# Patient Record
Sex: Female | Born: 1990 | Hispanic: Yes | Marital: Married | Smoking: Never smoker
Health system: Southern US, Community
[De-identification: ages and names within clinical notes are randomized; demographics above are authoritative.]

## PROBLEM LIST (undated history)

## (undated) DIAGNOSIS — F419 Anxiety disorder, unspecified: Secondary | ICD-10-CM

## (undated) DIAGNOSIS — J45909 Unspecified asthma, uncomplicated: Secondary | ICD-10-CM

## (undated) HISTORY — DX: Anxiety disorder, unspecified: F41.9

## (undated) HISTORY — PX: TONSILLECTOMY: SUR1361

## (undated) HISTORY — PX: TONSILLECTOMY AND ADENOIDECTOMY: SHX28

## (undated) HISTORY — DX: Unspecified asthma, uncomplicated: J45.909

---
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
## 2011-05-31 ENCOUNTER — Emergency Department (HOSPITAL_COMMUNITY)
Admission: EM | Admit: 2011-05-31 | Discharge: 2011-05-31 | Disposition: A | Discharge status: Home / Self Care | Payer: 59 | Attending: Emergency Medicine | Admitting: Emergency Medicine

## 2011-05-31 ENCOUNTER — Encounter: Payer: Self-pay | Admitting: Emergency Medicine

## 2011-05-31 DIAGNOSIS — IMO0002 Reserved for concepts with insufficient information to code with codable children: Secondary | ICD-10-CM | POA: Insufficient documentation

## 2011-05-31 DIAGNOSIS — N898 Other specified noninflammatory disorders of vagina: Secondary | ICD-10-CM | POA: Insufficient documentation

## 2011-05-31 LAB — POCT PREGNANCY, URINE: Preg Test, Ur: NEGATIVE

## 2011-05-31 LAB — WET PREP, GENITAL: Yeast Wet Prep HPF POC: NONE SEEN

## 2011-05-31 MED ORDER — LEVONORGESTREL 0.75 MG PO TABS: 0.7500 mg | ORAL_TABLET | Freq: Two times a day (BID) | ORAL | Status: DC

## 2011-05-31 NOTE — ED Notes (Signed)
Patient stable upon discharge.  

## 2011-05-31 NOTE — ED Notes (Signed)
Patient stable and resting
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
## 2011-06-01 LAB — GC/CHLAMYDIA PROBE AMP, GENITAL: Chlamydia, DNA Probe: NEGATIVE

## 2011-06-01 NOTE — ED Provider Notes (Signed)
Medical screening examination/treatment/procedure(s) were performed by non-physician practitioner and as supervising physician I was immediately available for consultation/collaboration.   Ardyce Heyer A Kainoah Bartosiewicz, MD 06/01/11 1702 

## 2011-07-12 ENCOUNTER — Encounter: Payer: 59 | Admitting: Family

## 2011-07-28 ENCOUNTER — Encounter: Payer: 59 | Admitting: Advanced Practice Midwife

## 2017-03-11 ENCOUNTER — Encounter (HOSPITAL_COMMUNITY): Payer: Self-pay | Admitting: Emergency Medicine

## 2017-03-11 ENCOUNTER — Ambulatory Visit (HOSPITAL_COMMUNITY)
Admission: EM | Admit: 2017-03-11 | Discharge: 2017-03-11 | Disposition: A | Discharge status: Home / Self Care | Payer: Self-pay | Attending: Internal Medicine | Admitting: Internal Medicine

## 2017-03-11 DIAGNOSIS — I951 Orthostatic hypotension: Secondary | ICD-10-CM

## 2017-03-11 DIAGNOSIS — R42 Dizziness and giddiness: Secondary | ICD-10-CM

## 2017-03-11 DIAGNOSIS — Z3202 Encounter for pregnancy test, result negative: Secondary | ICD-10-CM

## 2017-03-11 LAB — POCT I-STAT, CHEM 8
BUN: 14 mg/dL (ref 6–20)
CHLORIDE: 103 mmol/L (ref 101–111)
CREATININE: 0.7 mg/dL (ref 0.44–1.00)
Calcium, Ion: 1.17 mmol/L (ref 1.15–1.40)
GLUCOSE: 87 mg/dL (ref 65–99)
HCT: 46 % (ref 36.0–46.0)
HEMOGLOBIN: 15.6 g/dL — AB (ref 12.0–15.0)
POTASSIUM: 4.1 mmol/L (ref 3.5–5.1)
Sodium: 139 mmol/L (ref 135–145)
TCO2: 25 mmol/L (ref 22–32)

## 2017-03-11 LAB — POCT URINALYSIS DIP (DEVICE)
Bilirubin Urine: NEGATIVE
Glucose, UA: NEGATIVE mg/dL
Ketones, ur: NEGATIVE mg/dL
NITRITE: NEGATIVE
PH: 6 (ref 5.0–8.0)
Protein, ur: NEGATIVE mg/dL
Specific Gravity, Urine: 1.015 (ref 1.005–1.030)
Urobilinogen, UA: 0.2 mg/dL (ref 0.0–1.0)

## 2017-03-11 LAB — POCT PREGNANCY, URINE: PREG TEST UR: NEGATIVE

## 2017-03-11 MED ORDER — SODIUM CHLORIDE 0.9 % IV BOLUS (SEPSIS)
1000.0000 mL | Freq: Once | INTRAVENOUS | Status: AC
  Administered 2017-03-11: 1000 mL via INTRAVENOUS

## 2017-03-11 NOTE — Discharge Instructions (Signed)
Emergency Department Resource Guide °1) Find a Doctor and Pay Out of Pocket °Although you won't have to find out who is covered by your insurance plan, it is a good idea to ask around and get recommendations. You will then need to call the office and see if the doctor you have chosen will accept you as a new patient and what types of options they offer for patients who are self-pay. Some doctors offer discounts or will set up payment plans for their patients who do not have insurance, but you will need to ask so you aren't surprised when you get to your appointment. ° °2) Contact Your Local Health Department °Not all health departments have doctors that can see patients for sick visits, but many do, so it is worth a call to see if yours does. If you don't know where your local health department is, you can check in your phone book. The CDC also has a tool to help you locate your state's health department, and many state websites also have listings of all of their local health departments. ° °3) Find a Walk-in Clinic °If your illness is not likely to be very severe or complicated, you may want to try a walk in clinic. These are popping up all over the country in pharmacies, drugstores, and shopping centers. They're usually staffed by nurse practitioners or physician assistants that have been trained to treat common illnesses and complaints. They're usually fairly quick and inexpensive. However, if you have serious medical issues or chronic medical problems, these are probably not your best option. ° °No Primary Care Doctor: °- Call [facility] at  832-8000 - they can help you locate a primary care doctor that  accepts your insurance, provides certain services, etc. °- Physician Referral Service- 1-800-533-3463 ° °Chronic Pain Problems: °Organization         Address  Phone   Notes  °[facility]  ([phone]) [phone] Patients need to be referred by their primary care doctor.  ° °Medication  Assistance: °Organization         Address  Phone   Notes  °[place] [facility] [address] °[place], [place] [address] (336) 641-8030 --Must be a resident of [place] °-- Must have NO insurance coverage whatsoever (no Medicaid/ Medicare, etc.) °-- The pt. MUST have a primary care doctor that directs their care regularly and follows them in the community °  °[facility]  (866) 331-1348   °[employer]  (888) 892-1162   ° °Agencies that provide inexpensive medical care: °Organization         Address                                                       Phone                                                                            Notes  °[facility]  (336) 832-8035   °[facility]    (336)   832-7272   °[facility] [address] °[place], [place] [address] (336) 832-4777   °[facility] [address], °[place] (336) 271-4999   °[facility]    (336) 373-0678   °[facility]    (336) 272-1050   °[facility] ° [address], [place] Phone:  (336) 832-4444, Fax:  (336) 832-4440 Hours of Operation:  9 am - 6 pm, M-F.  Also accepts Medicaid/Medicare and self-pay.  °[facility] ° [address], [place] Phone: (336) [phone], Fax: (336) [phone]. Hours of Operation:  8:30 am - 5:30 pm, M-F.  Also accepts Medicaid and self-pay.  °[facility] [place] [address], [place] Phone: (336) 878-6027   °[facility] [address], [place], [place] (336)723-1848, Ext. 123 Mondays & Thursdays: 7-9 AM.  First 15 patients are seen on a first come, first serve basis. °  ° °Medicaid-accepting [place] Providers: ° °Organization         Address                                                                       Phone                               Notes  °[facility] [address], [place] (336) [phone] Also accepts self-pay patients.  °[facility] [address], [place] ° (336) [phone]   °[facility] [address], [place] (336) 288-8857   °[facility] [address] [place] [address], [place] (336) 299-7000   °[facility] [address], [place]  ° (336) [phone] Only accepts [place] Medicaid patients after they have their name applied to their card.  ° °Self-Pay (no insurance) in [place]: °  °Organization         Address                                                     Phone               Notes  °Sickle Cell Patients, [facility] [address], [place] (336) [phone]   °[facility] [address], [place] (336) [phone]   °[facility] ° [address], [place] (336) [phone]   °[facility]/Dr. Osei-Bonsu ° [address] [place] [address], [place] or [address], [place] (336) [phone] Phone number for both [place] and [place] locations is the same.  °[facility] [address], [place] (336) [phone]   °[facility] [address] [place] [address], [place] or [address] (336) 852-7530 °(336) 878-2260   °[facility] [address], [place] (336) 350-1642, phone; (336) 294-5005, fax Sees patients 1st and 3rd Saturday of   every month.  Must not qualify for public or private insurance (i.e. Medicaid, Medicare, [place] Health Choice, Veterans' Benefits) • Household income should be no more than 200% of the poverty level •The clinic cannot treat you if you are pregnant or think you are pregnant • Sexually transmitted diseases are not treated at the clinic.  ° °_____________Dental Care:______________ °Organization         Address                                  Phone                       Notes  °[place]  [facility] [address], [place] (336) [phone] Accepts children up to age 21 who are enrolled in Medicaid or [place] Health Choice; pregnant women with a Medicaid card; and children who have applied for Medicaid or [place] Health Choice, but were declined, whose parents can pay a reduced fee at time of service.  °[place] [place]  [address], [place] (336) 641-7733 Accepts children up to age 21 who are enrolled in Medicaid or [place] Health Choice; pregnant women with a Medicaid card; and children who have applied for Medicaid or [place] Health Choice, but were declined, whose parents can pay a reduced fee at time of service.  °[facility] ° [address], [place] (336) [phone] Patients are seen by appointment only. Walk-ins are not accepted. [facility] will see patients 18 years of age and older. °Monday - Tuesday (8am-5pm) °Most Wednesdays (8:30-5pm) °$30 per visit, cash only  °[facility] ° [address], [place] (336) [phone] Patients are seen by appointment only. Walk-ins are not accepted. [facility] will see patients 18 years of age and older. °One Wednesday Evening (Monthly: Volunteer Based).  $30 per visit, cash only  °[facility]  (919) [phone] for adults; Children under age 4, call Graduate Pediatric Dentistry at (919) [phone]. Children aged 4-14, please call (919) [phone] to request a pediatric application. ° Dental services are provided in all areas of dental care including fillings, crowns and bridges, complete and partial dentures, implants, gum treatment, root canals, and extractions. Preventive care is also provided. Treatment is provided to both adults and children. °Patients are selected via a lottery and there is often a waiting list. °  °[facility] [address], °[place] ° (336) 763-8833  [email or web address] °  °[facility] [address], [place], [place] (336)723-1848, Ext. 123 Second and Fourth Thursday of each month, opens at 6:30 AM; Clinic ends at 9 AM.  Patients are seen on a first-come first-served basis, and a limited number are seen during each clinic.  ° °[facility] ° [address], [place],  (336) [phone]   Eligibility Requirements °You must have lived in [place], [place], or [place] counties for at least the last three months. °  You cannot be eligible for state or federal sponsored healthcare insurance, including Veterans Administration, Medicaid, or Medicare. °  You generally cannot be eligible for healthcare insurance through your employer.  °  How to apply: °Eligibility screenings are held every Tuesday and Wednesday afternoon from 1:00 pm until 4:00 pm. You do not need an appointment for the interview!  °  [facility] [address], [place], [place] [phone]   °[facility]  [phone]   °[facility]  [phone]   °[facility]  [phone]   ° °

## 2017-03-11 NOTE — ED Provider Notes (Signed)
[facility]    CSN: [id number] Arrival date & time: 03/11/17  1104     History   Chief Complaint Chief Complaint  Patient presents with  . Dizziness    HPI Nancy Blevins is a 26 y.o. female.   HPI  Nancy Blevins is a 26 y.o. female presenting to UC with c/o near syncope with lightheadedness and dizziness that started yesterday.  She still has mild lightheadedness today.  Hx of similar episodes 3 other times but no known cause found.  She has a family hx of enlarged hearts but pt states she had an EKG and an echo in the past, both were normal. No hx of anemia. No recent head injury or illness. She has been eating and drinking well. She is on birth control but has been on that for multiple years. No other medications.    History reviewed. No pertinent past medical history.  Patient Active Problem List   Diagnosis Date Noted  . ASTHMA 01/26/2007    Past Surgical History:  Procedure Laterality Date  . TONSILLECTOMY      OB History    No data available       Home Medications    Prior to Admission medications   Medication Sig Start Date End Date Taking? Authorizing Provider  levonorgestrel (PLAN B) 0.75 MG tablet Take 1 tablet (0.75 mg total) by mouth every 12 (twelve) hours. 05/31/11   Grant Fontana, PA-C    Family History History reviewed. No pertinent family history.  Social History Social History  Substance Use Topics  . Smoking status: Never Smoker  . Smokeless tobacco: Never Used  . Alcohol use Yes     Allergies   Cefdinir; Cefuroxime axetil; Cephalosporins; and Penicillins   Review of Systems Review of Systems  Constitutional: Negative for chills and fever.  Eyes: Negative for visual disturbance.  Respiratory: Negative for chest tightness and shortness of breath.   Cardiovascular: Negative for chest pain and palpitations.  Gastrointestinal: Negative for nausea and vomiting.  Neurological: Positive for dizziness and  light-headedness. Negative for syncope, weakness and headaches.     Physical Exam Triage Vital Signs ED Triage Vitals [03/11/17 1127]  Enc Vitals Group     BP 90/65     Pulse Rate 80     Resp 16     Temp 98.2 F (36.8 C)     Temp Source Oral     SpO2 98 %     Weight      Height      Head Circumference      Peak Flow      Pain Score      Pain Loc      Pain Edu?      Excl. in GC?    Orthostatic VS for the past 24 hrs:  BP- Lying Pulse- Lying BP- Sitting Pulse- Sitting BP- Standing at 0 minutes Pulse- Standing at 0 minutes  03/11/17 1205 99/76 74 (!) 83/59 81 129/80 91    Updated Vital Signs BP 90/65 (BP Location: Left Arm)   Pulse 80   Temp 98.2 F (36.8 C) (Oral)   Resp 16   SpO2 98%     Physical Exam  Constitutional: She is oriented to person, place, and time. She appears well-developed and well-nourished. No distress.  HENT:  Head: Normocephalic and atraumatic.  Mouth/Throat: Oropharynx is clear and moist.  Eyes: Pupils are equal, round, and reactive to light. Conjunctivae and EOM are normal.  Neck:  Normal range of motion.  Cardiovascular: Normal rate and regular rhythm.   Pulmonary/Chest: Effort normal and breath sounds normal. No respiratory distress. She has no wheezes. She has no rales.  Musculoskeletal: Normal range of motion.  Neurological: She is alert and oriented to person, place, and time. No cranial nerve deficit.  CN II-XII in tact. Speech is clear, alert to person, place and time. Normal gait.   Skin: Skin is warm and dry. She is not diaphoretic.  Psychiatric: She has a normal mood and affect. Her behavior is normal.  Nursing note and vitals reviewed.    UC Treatments / Results  Labs (all labs ordered are listed, but only abnormal results are displayed) Labs Reviewed  POCT URINALYSIS DIP (DEVICE) - Abnormal; Notable for the following:       Result Value   Hgb urine dipstick TRACE (*)    Leukocytes, UA TRACE (*)    All other components  within normal limits  POCT I-STAT, CHEM 8 - Abnormal; Notable for the following:    Hemoglobin 15.6 (*)    All other components within normal limits  POCT PREGNANCY, URINE    EKG  EKG Interpretation None       Radiology No results found.  Procedures Procedures (including critical care time)  Medications Ordered in UC Medications  sodium chloride 0.9 % bolus 1,000 mL (1,000 mLs Intravenous New Bag/Given 03/11/17 1235)     Initial Impression / Assessment and Plan / UC Course  I have reviewed the triage vital signs and the nursing notes.  Pertinent labs & imaging results that were available during my care of the patient were reviewed by me and considered in my medical decision making (see chart for details).     UA and chem-8: unremarkable Urine preg: negative  Mild orthostatic hypotension. Pt given 1L IV fluids   Pt doing well, eating crackers and peanut butter.  Will have orthostatic vitals performed prior to discharge.  Pt will likely be discharged safely home to f/u with PCP. Care resumed at shift change by Azucena Fallen, PA-C.   Final Clinical Impressions(s) / UC Diagnoses   Final diagnoses:  Lightheadedness  Orthostatic hypotension    New Prescriptions New Prescriptions   No medications on file     Controlled Substance Prescriptions [place] Controlled Substance Registry consulted? Not Applicable   Rolla Plate 03/11/17 1305

## 2017-03-11 NOTE — ED Triage Notes (Signed)
Pt reports she "almost" had a syncope episode yest associated w/dizziness and LH  Has had x3 similar episodes this years  Sx have carried onto today  Sexually active in a monogamous relationship x3 years; does not use condoms... On BC pills.   Denies fevers, v/n/d, HAD

## 2017-03-15 ENCOUNTER — Telehealth: Payer: Self-pay

## 2017-03-15 NOTE — Telephone Encounter (Signed)
SENT NOTES TO SCHEDULING 

## 2017-03-16 ENCOUNTER — Telehealth: Payer: Self-pay | Admitting: Cardiology

## 2017-03-16 NOTE — Progress Notes (Signed)
Cardiology Office Note   Date:  03/17/2017   ID:  Nancy Blevins, DOB 10-23-1990, MRN [id number]  PCP:  Iona Hansen, NP  Cardiologist:   Cannie Muckle [place], MD   Chief Complaint  Patient presents with  . Near Syncope      History of Present Illness: Nancy Blevins is a 26 y.o. female who is seen at the request of Dr. Yetta Barre for evaluation of near syncope, palpitations, and hypotension.  She states that 3 times in the past year she has symptoms of tachycardia followed by a feeling of almost passing out. 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 Felt her heart racing first then developed lightheadedness and feeling she might pass out. Did not sit down. Symptoms improved after a few seconds. Prior episodes similar. No diaphoresis, N/V, dyspnea. No clear triggers. She does have a lot of anxiety. She was evaluated 2 years ago with a 48 hour Holter monitor that was normal. Was seen by Yates Decamp earlier this year and apparently Echo was done and was normal. No family history of syncope or premature death. No history of murmur. Generally she is in excellent health.     History reviewed. No pertinent past medical history.  Past Surgical History:  Procedure Laterality Date  . TONSILLECTOMY       Current Outpatient Prescriptions  Medication Sig Dispense Refill  . escitalopram (LEXAPRO) 10 MG tablet Take 5-10 mg by mouth daily.    Janetta Hora ESTRADIOL PO Take 1 tablet by mouth daily.     No current facility-administered medications for this visit.     Allergies:   Cefdinir; Cefuroxime axetil; Cefuroxime axetil; Cephalosporins; Penicillins; and Cefprozil    Social History:  The patient  reports that she has never smoked. She has never used smokeless tobacco. She reports that she drinks alcohol. She reports that she does not use drugs.   Family History:  The patient's family history is reviewed and is negative for any cardiac disease in immediate family.      ROS:  Please see the history of present illness.   Otherwise, review of systems are positive for none.   All other systems are reviewed and negative.    PHYSICAL EXAM: VS:  BP 100/72 (BP Location: Left Arm)   Pulse (!) 107   Ht 5' 4.5" (1.638 m)   Wt 113 lb 9.6 oz (51.5 kg)   BMI 19.20 kg/m  , BMI Body mass index is 19.2 kg/m.  Orthostatic BP 106/73 supine pulse 97 110/78 sitting, pulse 102, 111/77 standing, pulse 109.  GEN: Young, thin female, in no acute distress  HEENT: normal  Neck: no JVD, carotid bruits, or masses Cardiac: RRR; no murmurs, rubs, or gallops,no edema  Respiratory:  clear to auscultation bilaterally, normal work of breathing GI: soft, nontender, nondistended, + BS MS: no deformity or atrophy  Skin: warm and dry, no rash Neuro:  Strength and sensation are intact Psych: euthymic mood, full affect   EKG:  EKG is ordered today. The ekg ordered today demonstrates NSR rate 101, normal. I have personally reviewed and interpreted this study.    Recent Labs: 03/11/2017: BUN 14; Creatinine, Ser 0.70; Hemoglobin 15.6; Potassium 4.1; Sodium 139    Lipid Panel No results found for: CHOL, TRIG, HDL, CHOLHDL, VLDL, LDLCALC, LDLDIRECT    Wt Readings from Last 3 Encounters:  03/17/17 113 lb 9.6 oz (51.5 kg)      Other studies Reviewed: Additional studies/ records that  were reviewed today include:   Labs dated 06/17/16: normal CBC, CMET, TSH.  ASSESSMENT AND PLAN:  1.  Tachycardia associated with near syncope. Need to rule out primary arrhythmia. I suspect symptoms mostly related to a vascular reflex issue. Recommend staying well hydrated and liberalizing salt intake. Recommend regular aerobic exercise. Will have her wear a 30 day event monitor. Recently started on antianxiety therapy.   Current medicines are reviewed at length with the patient today.  The patient does not have concerns regarding medicines.  The following changes hav e been made:  no  change  Labs/ tests ordered today include:   Orders Placed This Encounter  Procedures  . Cardiac event monitor  . EKG 12-Lead     Disposition:   FU TBD  Signed, Karam Dunson [place], MD  03/17/2017 4:22 PM    [facility] [address] [place], [place], [address] Phone [phone], Fax [phone]

## 2017-03-16 NOTE — Telephone Encounter (Signed)
Received records from [facility] for appointment on 03/17/17 with Dr Swaziland.  Records put with Dr Elvis Coil schedule for 03/17/17. lp

## 2017-03-17 ENCOUNTER — Ambulatory Visit (INDEPENDENT_AMBULATORY_CARE_PROVIDER_SITE_OTHER): Payer: No Typology Code available for payment source | Admitting: Cardiology

## 2017-03-17 ENCOUNTER — Encounter: Payer: Self-pay | Admitting: Cardiology

## 2017-03-17 VITALS — BP 100/72 | HR 107 | Ht 64.5 in | Wt 113.6 lb

## 2017-03-17 DIAGNOSIS — R55 Syncope and collapse: Secondary | ICD-10-CM

## 2017-03-17 DIAGNOSIS — R002 Palpitations: Secondary | ICD-10-CM

## 2017-03-17 NOTE — Patient Instructions (Signed)
We will have you wear a 30 day event monitor  Liberalize sodium intake in diet and stay well hydrated

## 2017-04-14 ENCOUNTER — Ambulatory Visit (INDEPENDENT_AMBULATORY_CARE_PROVIDER_SITE_OTHER): Payer: No Typology Code available for payment source

## 2017-04-14 DIAGNOSIS — R002 Palpitations: Secondary | ICD-10-CM | POA: Diagnosis not present

## 2017-04-14 DIAGNOSIS — R55 Syncope and collapse: Secondary | ICD-10-CM | POA: Diagnosis not present

## 2017-08-20 NOTE — Progress Notes (Deleted)
Cardiology Office Note   Date:  08/20/2017   ID:  Nancy Blevins, DOB Jan 01, 1991, MRN [id number]  PCP:  Iona Hansen, NP  Cardiologist:   Findley Blankenbaker [place], MD   No chief complaint on file.     History of Present Illness: Nancy Blevins is a 27 y.o. female who is seen for follow up  of near syncope, palpitations, and hypotension.   She states that 3 times in the past year she has symptoms of tachycardia followed by a feeling of almost passing out. 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 Felt her heart racing first then developed lightheadedness and feeling she might pass out. Did not sit down. Symptoms improved after a few seconds. Prior episodes similar. No diaphoresis, N/V, dyspnea. No clear triggers. She does have a lot of anxiety. She was evaluated 2 years ago with a 48 hour Holter monitor that was normal. Was seen by Yates Decamp earlier last year and apparently Echo was done and was normal. No family history of syncope or premature death. No history of murmur. Generally she is in excellent health. On last visit she wore an event monitor that showed rare PACs.     No past medical history on file.  Past Surgical History:  Procedure Laterality Date  . TONSILLECTOMY       Current Outpatient Medications  Medication Sig Dispense Refill  . escitalopram (LEXAPRO) 10 MG tablet Take 5-10 mg by mouth daily.    Janetta Hora ESTRADIOL PO Take 1 tablet by mouth daily.     No current facility-administered medications for this visit.     Allergies:   Cefdinir; Cefuroxime axetil; Cefuroxime axetil; Cephalosporins; Penicillins; and Cefprozil    Social History:  The patient  reports that  has never smoked. she has never used smokeless tobacco. She reports that she drinks alcohol. She reports that she does not use drugs.   Family History:  The patient's family history is reviewed and is negative for any cardiac disease in immediate family.     ROS:  Please see  the history of present illness.   Otherwise, review of systems are positive for none.   All other systems are reviewed and negative.    PHYSICAL EXAM: VS:  There were no vitals taken for this visit. , BMI There is no height or weight on file to calculate BMI.  Orthostatic BP 106/73 supine pulse 97 110/78 sitting, pulse 102, 111/77 standing, pulse 109.  GEN: Young, thin female, in no acute distress  HEENT: normal  Neck: no JVD, carotid bruits, or masses Cardiac: RRR; no murmurs, rubs, or gallops,no edema  Respiratory:  clear to auscultation bilaterally, normal work of breathing GI: soft, nontender, nondistended, + BS MS: no deformity or atrophy  Skin: warm and dry, no rash Neuro:  Strength and sensation are intact Psych: euthymic mood, full affect   EKG:  EKG is ordered today. The ekg ordered today demonstrates NSR rate 101, normal. I have personally reviewed and interpreted this study.    Recent Labs: 03/11/2017: BUN 14; Creatinine, Ser 0.70; Hemoglobin 15.6; Potassium 4.1; Sodium 139    Lipid Panel No results found for: CHOL, TRIG, HDL, CHOLHDL, VLDL, LDLCALC, LDLDIRECT    Wt Readings from Last 3 Encounters:  03/17/17 113 lb 9.6 oz (51.5 kg)      Other studies Reviewed: Additional studies/ records that were reviewed today include:   Labs dated 06/17/16: normal CBC, CMET, TSH. Dated 03/11/17 normal Chemistries and  CBC  Event monitor:Study Highlights    Normal sinus rhythm with sinus tachycardia  Rare PACs.  Symptoms of fluttering appear to correlate with PACs       ASSESSMENT AND PLAN:  1.  Tachycardia associated with near syncope. Need to rule out primary arrhythmia. I suspect symptoms mostly related to a vascular reflex issue. Recommend staying well hydrated and liberalizing salt intake. Recommend regular aerobic exercise. Will have her wear a 30 day event monitor. Recently started on antianxiety therapy.   Current medicines are reviewed at length with the  patient today.  The patient does not have concerns regarding medicines.  The following changes hav e been made:  no change  Labs/ tests ordered today include:   No orders of the defined types were placed in this encounter.    Disposition:   FU TBD  Signed, Kortland Nichols [place], MD  08/20/2017 7:30 AM    [facility] [address], [place], [place], [address] Phone [phone], Fax [phone]

## 2017-08-22 ENCOUNTER — Ambulatory Visit: Payer: No Typology Code available for payment source | Admitting: Cardiology

## 2017-08-22 DIAGNOSIS — R0989 Other specified symptoms and signs involving the circulatory and respiratory systems: Secondary | ICD-10-CM

## 2017-08-23 ENCOUNTER — Encounter: Payer: Self-pay | Admitting: Cardiology

## 2017-09-21 ENCOUNTER — Ambulatory Visit (HOSPITAL_COMMUNITY)
Admission: EM | Admit: 2017-09-21 | Discharge: 2017-09-21 | Disposition: A | Discharge status: Home / Self Care | Payer: Self-pay | Attending: Urgent Care | Admitting: Urgent Care

## 2017-09-21 ENCOUNTER — Encounter (HOSPITAL_COMMUNITY): Payer: Self-pay | Admitting: Emergency Medicine

## 2017-09-21 DIAGNOSIS — R519 Headache, unspecified: Secondary | ICD-10-CM

## 2017-09-21 DIAGNOSIS — Z9109 Other allergy status, other than to drugs and biological substances: Secondary | ICD-10-CM

## 2017-09-21 DIAGNOSIS — R51 Headache: Principal | ICD-10-CM

## 2017-09-21 MED ORDER — LORATADINE 10 MG PO TABS: 10.0000 mg | ORAL_TABLET | Freq: Every day | ORAL | 11 refills | Status: DC

## 2017-09-21 MED ORDER — PSEUDOEPHEDRINE HCL ER 120 MG PO TB12: 120.0000 mg | ORAL_TABLET | Freq: Two times a day (BID) | ORAL | 3 refills | Status: DC

## 2017-09-21 MED ORDER — PREDNISONE 20 MG PO TABS: ORAL_TABLET | ORAL | 0 refills | Status: DC

## 2017-09-21 NOTE — Discharge Instructions (Signed)
Hydrate well with at least 2 liters (1 gallon) of water daily.  °

## 2017-09-21 NOTE — ED Provider Notes (Signed)
  MRN: [id number] DOB: 12/25/1990  Subjective:   Nancy Blevins is a 27 y.o. female presenting for 3 day history of sinus pressure, generalized pressure throughout her head, stuffy nose. Has a history of allergies, currently managing this with Flonase which she started yesterday.  She has tried Zyrtec in the past but felt that this was not working so she is not taking any antihistamine right now.  She did take over-the-counter Sudafed.  Denies fever, sinus pain, ear pain, ear drainage, sore throat, cough.  Denies smoking cigarettes.  No current facility-administered medications for this encounter.   Current Outpatient Medications:  .  escitalopram (LEXAPRO) 10 MG tablet, Take 5-10 mg by mouth daily., Disp: , Rfl:  .  NORETHINDRONE-ETH ESTRADIOL PO, Take 1 tablet by mouth daily., Disp: , Rfl:     Allergies  Allergen Reactions  . Cefdinir Other (See Comments)    dont remember   . Cefuroxime Axetil Other (See Comments)    dont remember  . Cefuroxime Axetil Other (See Comments)    dont remember  . Cephalosporins Other (See Comments)    Doesn't remember  . Penicillins   . Cefprozil Rash    PMH includes allergies, childhood asthma.    Past Surgical History:  Procedure Laterality Date  . TONSILLECTOMY      Objective:   Vitals: BP 116/79   Pulse 71   Temp 98.3 F (36.8 C)   Resp 16   SpO2 100%   Physical Exam  Constitutional: She is oriented to person, place, and time. She appears well-developed and well-nourished.  HENT:  Oropharynx with postnasal drainage.  No sinus tenderness.  TMs normal, without erythema, no tragus tenderness.  Eyes: Right eye exhibits no discharge. Left eye exhibits no discharge.  Neck: Normal range of motion. Neck supple.  Cardiovascular: Normal rate.  Pulmonary/Chest: Effort normal.  Lymphadenopathy:    She has no cervical adenopathy.  Neurological: She is alert and oriented to person, place, and time.  Skin: Skin is warm and dry.   Psychiatric: She has a normal mood and affect.   Assessment and Plan :   Sinus headache  Environmental allergies  Patient requested antibiotic for sinus infection.  I counseled that we should try to manage her allergies first with Flonase, Claritin, pseudoephedrine.  I provided patient with a prescription for prednisone in case she has no improvement by the weekend.  Patient is to return to clinic for recheck thereafter.    Wallis BambergMani, Roman Sandall, [place]PA-C 09/21/17 [id number]

## 2017-09-21 NOTE — ED Triage Notes (Signed)
Pt c/o pressure in her head x3, also states her L eye has been watering.

## 2017-11-08 ENCOUNTER — Encounter (HOSPITAL_COMMUNITY): Payer: Self-pay | Admitting: Emergency Medicine

## 2017-11-08 ENCOUNTER — Ambulatory Visit (HOSPITAL_COMMUNITY)
Admission: EM | Admit: 2017-11-08 | Discharge: 2017-11-08 | Disposition: A | Discharge status: Home / Self Care | Payer: Self-pay | Attending: Family Medicine | Admitting: Family Medicine

## 2017-11-08 DIAGNOSIS — N3090 Cystitis, unspecified without hematuria: Secondary | ICD-10-CM | POA: Diagnosis present

## 2017-11-08 DIAGNOSIS — Z880 Allergy status to penicillin: Secondary | ICD-10-CM | POA: Insufficient documentation

## 2017-11-08 DIAGNOSIS — Z881 Allergy status to other antibiotic agents status: Secondary | ICD-10-CM | POA: Insufficient documentation

## 2017-11-08 DIAGNOSIS — Z3202 Encounter for pregnancy test, result negative: Secondary | ICD-10-CM

## 2017-11-08 DIAGNOSIS — R300 Dysuria: Secondary | ICD-10-CM

## 2017-11-08 DIAGNOSIS — Z79899 Other long term (current) drug therapy: Secondary | ICD-10-CM | POA: Insufficient documentation

## 2017-11-08 LAB — POCT URINALYSIS DIP (DEVICE)
Bilirubin Urine: NEGATIVE
GLUCOSE, UA: NEGATIVE mg/dL
KETONES UR: NEGATIVE mg/dL
Nitrite: NEGATIVE
PH: 5.5 (ref 5.0–8.0)
PROTEIN: NEGATIVE mg/dL
Specific Gravity, Urine: <=1.005 (ref 1.005–1.030)
UROBILINOGEN UA: 0.2 mg/dL (ref 0.0–1.0)

## 2017-11-08 LAB — POCT PREGNANCY, URINE: PREG TEST UR: NEGATIVE

## 2017-11-08 MED ORDER — SULFAMETHOXAZOLE-TRIMETHOPRIM 800-160 MG PO TABS: 1.0000 | ORAL_TABLET | Freq: Two times a day (BID) | ORAL | 0 refills | Status: AC

## 2017-11-08 NOTE — Discharge Instructions (Signed)
Push fluids Take the antibiotic 2 x a day We will call with culture results

## 2017-11-08 NOTE — ED Triage Notes (Signed)
Pt sts dysuria  And vaginal discharge with odor

## 2017-11-08 NOTE — ED Provider Notes (Signed)
[facility]    CSN: [id number] Arrival date & time: 11/08/17  1800     History   Chief Complaint Chief Complaint  Patient presents with  . Dysuria    HPI Lori AVONELL LENIG is a 27 y.o. female.   HPI  Patient is here complaining of dysuria and frequency for 2 days.  No fever chills.  No nausea or vomiting.  No flank pain.  No prior history of any kidney stones or problems.  She states it feels like a bladder infection.  In addition she does have a scant amount of vaginal discharge.  She states that it is mildly malodorous.  No itching or discomfort.  She is been with her same partner for 4 years and declines need for any sexually transmitted disease testing.  History reviewed. No pertinent past medical history.  Patient Active Problem List   Diagnosis Date Noted  . Cystitis 11/08/2017  . ASTHMA 01/26/2007    Past Surgical History:  Procedure Laterality Date  . TONSILLECTOMY      OB History   None      Home Medications    Prior to Admission medications   Medication Sig Start Date End Date Taking? Authorizing Provider  escitalopram (LEXAPRO) 10 MG tablet Take 5-10 mg by mouth daily. 03/14/17   [provider]  loratadine (CLARITIN) 10 MG tablet Take 1 tablet (10 mg total) by mouth daily. 09/21/17   Wallis Bamberg, PA-C  NORETHINDRONE-ETH ESTRADIOL PO Take 1 tablet by mouth daily.    [provider]  predniSONE (DELTASONE) 20 MG tablet Take 2 tablets daily with breakfast. 09/21/17   Wallis Bamberg, PA-C  pseudoephedrine (SUDAFED 12 HOUR) 120 MG 12 hr tablet Take 1 tablet (120 mg total) by mouth 2 (two) times daily. 09/21/17   Wallis Bamberg, PA-C  sulfamethoxazole-trimethoprim (BACTRIM DS,SEPTRA DS) 800-160 MG tablet Take 1 tablet by mouth 2 (two) times daily for 3 days. 11/08/17 11/11/17  Eustace Moore, MD    Family History History reviewed. No pertinent family history.  Social History Social History   Tobacco Use  . Smoking status: Never  Smoker  . Smokeless tobacco: Never Used  Substance Use Topics  . Alcohol use: Yes  . Drug use: No     Allergies   Cefdinir; Cefuroxime axetil; Cefuroxime axetil; Cephalosporins; Penicillins; and Cefprozil   Review of Systems Review of Systems  Constitutional: Negative for chills and fever.  HENT: Negative for ear pain and sore throat.   Eyes: Negative for pain and visual disturbance.  Respiratory: Negative for cough and shortness of breath.   Cardiovascular: Negative for chest pain and palpitations.  Gastrointestinal: Negative for abdominal pain and vomiting.  Genitourinary: Positive for dysuria, frequency and vaginal discharge. Negative for flank pain and hematuria.  Musculoskeletal: Negative for arthralgias and back pain.  Skin: Negative for color change and rash.  Neurological: Negative for seizures and syncope.  All other systems reviewed and are negative.    Physical Exam Triage Vital Signs ED Triage Vitals [11/08/17 1831]  Enc Vitals Group     BP 108/69     Pulse Rate 71     Resp 18     Temp 98.7 F (37.1 C)     Temp Source Oral     SpO2 100 %     Weight      Height      Head Circumference      Peak Flow      Pain Score  Pain Loc      Pain Edu?      Excl. in GC?    No data found.  Updated Vital Signs BP 108/69 (BP Location: Left Arm)   Pulse 71   Temp 98.7 F (37.1 C) (Oral)   Resp 18   SpO2 100%   Visual Acuity Right Eye Distance:   Left Eye Distance:   Bilateral Distance:    Right Eye Near:   Left Eye Near:    Bilateral Near:     Physical Exam  Constitutional: She appears well-developed and well-nourished. No distress.  HENT:  Head: Normocephalic and atraumatic.  Mouth/Throat: Oropharynx is clear and moist.  Eyes: Pupils are equal, round, and reactive to light. Conjunctivae are normal.  Neck: Normal range of motion.  Cardiovascular: Normal rate, regular rhythm and normal heart sounds.  Pulmonary/Chest: Effort normal and breath  sounds normal. No respiratory distress.  No CVAT  Abdominal: Soft. She exhibits no distension.  No abdominal or suprapubic tenderness  Musculoskeletal: Normal range of motion. She exhibits no edema.  Neurological: She is alert.  Skin: Skin is warm and dry.     UC Treatments / Results  Labs (all labs ordered are listed, but only abnormal results are displayed) Labs Reviewed  POCT URINALYSIS DIP (DEVICE) - Abnormal; Notable for the following components:      Result Value   Hgb urine dipstick SMALL (*)    Leukocytes, UA SMALL (*)    All other components within normal limits  URINE CULTURE  POCT PREGNANCY, URINE  URINE CYTOLOGY ANCILLARY ONLY    EKG None  Radiology No results found.  Procedures Procedures (including critical care time)  Medications Ordered in UC Medications - No data to display  Initial Impression / Assessment and Plan / UC Course  I have reviewed the triage vital signs and the nursing notes.  Pertinent labs & imaging results that were available during my care of the patient were reviewed by me and considered in my medical decision making (see chart for details).     Discussed bladder infections, cystitis.  No evidence of kidney involvement.  Discussed vaginal infections.  Discussed STDs. Final Clinical Impressions(s) / UC Diagnoses   Final diagnoses:  Cystitis     Discharge Instructions     Push fluids Take the antibiotic 2 x a day We will call with culture results   ED Prescriptions    Medication Sig Dispense Auth. Provider   sulfamethoxazole-trimethoprim (BACTRIM DS,SEPTRA DS) 800-160 MG tablet Take 1 tablet by mouth 2 (two) times daily for 3 days. 6 tablet Eustace Moore, MD     Controlled Substance Prescriptions [place] Controlled Substance Registry consulted? Not Applicable   Eustace Moore, MD 11/08/17 [phone]

## 2017-11-09 ENCOUNTER — Emergency Department (HOSPITAL_COMMUNITY)
Admission: EM | Admit: 2017-11-09 | Discharge: 2017-11-09 | Disposition: A | Discharge status: Home / Self Care | Payer: Self-pay | Attending: Emergency Medicine | Admitting: Emergency Medicine

## 2017-11-09 ENCOUNTER — Encounter (HOSPITAL_COMMUNITY): Payer: Self-pay

## 2017-11-09 DIAGNOSIS — J45909 Unspecified asthma, uncomplicated: Secondary | ICD-10-CM | POA: Insufficient documentation

## 2017-11-09 DIAGNOSIS — Z79899 Other long term (current) drug therapy: Secondary | ICD-10-CM | POA: Insufficient documentation

## 2017-11-09 DIAGNOSIS — N12 Tubulo-interstitial nephritis, not specified as acute or chronic: Secondary | ICD-10-CM | POA: Insufficient documentation

## 2017-11-09 LAB — URINALYSIS, ROUTINE W REFLEX MICROSCOPIC
BILIRUBIN URINE: NEGATIVE
GLUCOSE, UA: NEGATIVE mg/dL
Ketones, ur: 20 mg/dL — AB
Nitrite: NEGATIVE
PROTEIN: 30 mg/dL — AB
Specific Gravity, Urine: 1.023 (ref 1.005–1.030)
pH: 5 (ref 5.0–8.0)

## 2017-11-09 LAB — CBC
HEMATOCRIT: 40.3 % (ref 36.0–46.0)
HEMOGLOBIN: 13.7 g/dL (ref 12.0–15.0)
MCH: 29.3 pg (ref 26.0–34.0)
MCHC: 34 g/dL (ref 30.0–36.0)
MCV: 86.3 fL (ref 78.0–100.0)
PLATELETS: 288 10*3/uL (ref 150–400)
RBC: 4.67 MIL/uL (ref 3.87–5.11)
RDW: 12.9 % (ref 11.5–15.5)
WBC: 16.1 10*3/uL — ABNORMAL HIGH (ref 4.0–10.5)

## 2017-11-09 LAB — BASIC METABOLIC PANEL
Anion gap: 10 (ref 5–15)
BUN: 16 mg/dL (ref 6–20)
CO2: 21 mmol/L — AB (ref 22–32)
CREATININE: 0.59 mg/dL (ref 0.44–1.00)
Calcium: 7.9 mg/dL — ABNORMAL LOW (ref 8.9–10.3)
Chloride: 108 mmol/L (ref 101–111)
GFR calc non Af Amer: >60 mL/min (ref >60)
Glucose, Bld: 111 mg/dL — ABNORMAL HIGH (ref 65–99)
Potassium: 3.8 mmol/L (ref 3.5–5.1)
Sodium: 139 mmol/L (ref 135–145)

## 2017-11-09 LAB — I-STAT CG4 LACTIC ACID, ED
Lactic Acid, Venous: 2.23 mmol/L (ref 0.5–1.9)
Lactic Acid, Venous: 2.17 mmol/L (ref 0.5–1.9)

## 2017-11-09 LAB — CBG MONITORING, ED: GLUCOSE-CAPILLARY: 98 mg/dL (ref 65–99)

## 2017-11-09 LAB — I-STAT BETA HCG BLOOD, ED (MC, WL, AP ONLY): I-stat hCG, quantitative: <5 m[IU]/mL (ref <5)

## 2017-11-09 MED ORDER — ONDANSETRON HCL 4 MG/2ML IJ SOLN
4.0000 mg | Freq: Once | INTRAMUSCULAR | Status: DC
  Filled 2017-11-09: qty 2

## 2017-11-09 MED ORDER — SODIUM CHLORIDE 0.9 % IV BOLUS
1000.0000 mL | Freq: Once | INTRAVENOUS | Status: AC
  Administered 2017-11-09: 1000 mL via INTRAVENOUS

## 2017-11-09 MED ORDER — CIPROFLOXACIN IN D5W 400 MG/200ML IV SOLN
400.0000 mg | Freq: Once | INTRAVENOUS | Status: AC
  Administered 2017-11-09: 400 mg via INTRAVENOUS
  Filled 2017-11-09: qty 200

## 2017-11-09 MED ORDER — CIPROFLOXACIN HCL 500 MG PO TABS: 500.0000 mg | ORAL_TABLET | Freq: Two times a day (BID) | ORAL | 0 refills | Status: DC

## 2017-11-09 MED ORDER — MORPHINE SULFATE (PF) 2 MG/ML IV SOLN
2.0000 mg | Freq: Once | INTRAVENOUS | Status: DC
  Filled 2017-11-09: qty 1

## 2017-11-09 MED ORDER — ONDANSETRON HCL 4 MG PO TABS: 4.0000 mg | ORAL_TABLET | Freq: Four times a day (QID) | ORAL | 0 refills | Status: DC

## 2017-11-09 NOTE — ED Notes (Signed)
Pt stated that she started a sulfa antibiotic last night. Soon after she started vomiting and had stomach cramping. She is on the antibiotic for a UTI. This is her first dose.

## 2017-11-09 NOTE — ED Notes (Signed)
Patient tolerating po fluids well

## 2017-11-09 NOTE — ED Notes (Signed)
Patient given water for po challenge 

## 2017-11-09 NOTE — ED Triage Notes (Signed)
Pt called EMS because she was vomiting after starting an antibiotic for a UTI Pt is also having orthostatic changes

## 2017-11-09 NOTE — ED Provider Notes (Addendum)
[facility]PT Provider Note   CSN: [id number] Arrival date & time: 11/09/17  0119    History   Chief Complaint No chief complaint on file.   HPI Nancy Blevins is a 27 y.o. female.  HPI   27 year old female presenting for further management of recent diagnosed urinary tract infection.  Patient endorsed urinary discomfort with frequency for the past 3 days.  She was seen and evaluated at [facility] yesterday for that. She did note some scant vaginal discharge but was not concerned about potential STI and did not receive testing for that.  Her urine shows evidence of urinary tract infection.  She did not have any systemic problem.  She was subsequently discharged home with Bactrim.  She did mention taking 1 dose of Bactrim at home and subsequently had persistent nausea, and vomiting.  States she may have vomited 15-20 times of fluid content, later with small trace of blood but no bilious content.  She endorsed lightheadedness and dizziness and feeling very weak.  She also endorsed worsening abdominal cramping.  Patient abdominal pain is moderate in severity.  EMS was contacted and patient brought here for further evaluation.  Since being in the ED, her nausea has since subsided but she still endorse abdominal cramping.  Denies fever, chills, back pain.  No history of recurrent urinary tract infection.  Patient was found to have positive orthostatic vital signs per EMS.  History reviewed. No pertinent past medical history.  Patient Active Problem List   Diagnosis Date Noted  . Cystitis 11/08/2017  . ASTHMA 01/26/2007    Past Surgical History:  Procedure Laterality Date  . TONSILLECTOMY       OB History   None      Home Medications    Prior to Admission medications   Medication Sig Start Date End Date Taking? Authorizing Provider  escitalopram (LEXAPRO) 10 MG tablet Take 5-10 mg by mouth daily. 03/14/17  Yes [provider]    NORETHINDRONE-ETH ESTRADIOL PO Take 1 tablet by mouth daily.   Yes [provider]  loratadine (CLARITIN) 10 MG tablet Take 1 tablet (10 mg total) by mouth daily. 09/21/17   Wallis Bamberg, PA-C  sulfamethoxazole-trimethoprim (BACTRIM DS,SEPTRA DS) 800-160 MG tablet Take 1 tablet by mouth 2 (two) times daily for 3 days. 11/08/17 11/11/17  Eustace Moore, MD    Family History History reviewed. No pertinent family history.  Social History Social History   Tobacco Use  . Smoking status: Never Smoker  . Smokeless tobacco: Never Used  Substance Use Topics  . Alcohol use: Yes  . Drug use: No     Allergies   Cefdinir; Cefuroxime axetil; Cefuroxime axetil; Cephalosporins; Penicillins; and Cefprozil   Review of Systems Review of Systems  All other systems reviewed and are negative.    Physical Exam Updated Vital Signs BP 101/68 (BP Location: Right Arm)   Pulse 88   Temp 97.6 F (36.4 C) (Oral)   Resp 20   Ht  (1.651 m)   Wt 52.2 kg (115 lb)   SpO2 99%   BMI 19.14 kg/m   Physical Exam  Constitutional: She is oriented to person, place, and time. She appears well-developed and well-nourished. No distress.  HENT:  Head: Atraumatic.  Eyes: Conjunctivae are normal.  Neck: Neck supple.  Cardiovascular: Normal rate and regular rhythm.  Pulmonary/Chest: Effort normal and breath sounds normal. She has no wheezes. She has no rales.  Abdominal: Soft. Bowel sounds are normal.  She exhibits no distension. There is tenderness (Mild diffuse abdominal tenderness without guarding or rebound tenderness.  Negative Murphy sign, no pain at McBurney's point.).  Genitourinary:  Genitourinary Comments: No CVA tenderness  Neurological: She is alert and oriented to person, place, and time.  Skin: No rash noted.  Psychiatric: She has a normal mood and affect.  Nursing note and vitals reviewed.    ED Treatments / Results  Labs (all labs ordered are listed, but only abnormal  results are displayed) Labs Reviewed  BASIC METABOLIC PANEL - Abnormal; Notable for the following components:      Result Value   CO2 21 (*)    Glucose, Bld 111 (*)    Calcium 7.9 (*)    All other components within normal limits  CBC - Abnormal; Notable for the following components:   WBC 16.1 (*)    All other components within normal limits  URINALYSIS, ROUTINE W REFLEX MICROSCOPIC - Abnormal; Notable for the following components:   APPearance HAZY (*)    Hgb urine dipstick SMALL (*)    Ketones, ur 20 (*)    Protein, ur 30 (*)    Leukocytes, UA MODERATE (*)    WBC, UA >50 (*)    Bacteria, UA RARE (*)    Non Squamous Epithelial 0-5 (*)    All other components within normal limits  I-STAT CG4 LACTIC ACID, ED - Abnormal; Notable for the following components:   Lactic Acid, Venous 2.23 (*)    All other components within normal limits  I-STAT CG4 LACTIC ACID, ED - Abnormal; Notable for the following components:   Lactic Acid, Venous 2.17 (*)    All other components within normal limits  CULTURE, BLOOD (ROUTINE X 2)  CULTURE, BLOOD (ROUTINE X 2)  CBG MONITORING, ED  I-STAT BETA HCG BLOOD, ED ([facility], [facility], [facility] ONLY)  I-STAT CG4 LACTIC ACID, ED    EKG EKG Interpretation  Date/Time:  Wednesday Nov 09 2017 02:52:04 EDT Ventricular Rate:  94 PR Interval:    QRS Duration: 90 QT Interval:  364 QTC Calculation: 456 R Axis:   93 Text Interpretation:  Sinus rhythm Borderline right axis deviation No significant change was found Confirmed by Paula Libra ([id number]) on 11/09/2017 2:56:24 AM Also confirmed by Paula Libra ([id number]), editor Earle Gell, Nash Dimmer [phone])  on 11/09/2017 8:50:26 AM   Radiology No results found.  Procedures Procedures (including critical care time)  Medications Ordered in ED Medications  ondansetron (ZOFRAN) injection 4 mg (4 mg Intravenous Refused 11/09/17 0700)  morphine 2 MG/ML injection 2 mg (2 mg Intravenous Refused 11/09/17 0700)  sodium chloride 0.9 % bolus 1,000 mL  (0 mLs Intravenous Stopped 11/09/17 0825)  ciprofloxacin (CIPRO) IVPB 400 mg (0 mg Intravenous Stopped 11/09/17 0826)  sodium chloride 0.9 % bolus 1,000 mL (0 mLs Intravenous Stopped 11/09/17 1017)     Initial Impression / Assessment and Plan / ED Course  I have reviewed the triage vital signs and the nursing notes.  Pertinent labs & imaging results that were available during my care of the patient were reviewed by me and considered in my medical decision making (see chart for details).     BP 95/63 (BP Location: Left Arm)   Pulse 97   Temp 100 F (37.8 C) (Rectal)   Resp 18   Ht  (1.651 m)   Wt 52.2 kg (115 lb)   SpO2 100%   BMI 19.14 kg/m    Final Clinical Impressions(s) / ED Diagnoses  Final diagnoses:  Pyelonephritis    ED Discharge Orders        Ordered    ciprofloxacin (CIPRO) 500 MG tablet  2 times daily     11/09/17 1309    ondansetron (ZOFRAN) 4 MG tablet  Every 6 hours     11/09/17 1309     6:50 AM Patient recently diagnosed with urinary tract infection however she is here due to persistent nausea vomiting after taking first dose of Bactrim.  Report of positive orthostatic vital signs by EMS.  On exam patient has mild abdominal discomfort without peritoneal sign.  No CVA tenderness.  Soft blood pressure however she is not tachycardic, no hypoxia, and no fever.  At this time, patient given IV fluid as well as IV ciprofloxacin since patient is allergic to cephalosporin, and penicillin agent.  We will continue to monitor.  She does have leukocytosis with WBC 16.1 and urine show signs of infection. Consider work up for sepsis, will check lactic acid. No focal point tenderness to right lower quadrant to suggest appendicitis at this time.  9:23 AM Labs remarkable for a lactic acid of 2.23,, WBC of 16.1, urine shows signs of urinary tract infection, and patient have been hypotensive with systolic blood pressures in the 80s despite receiving IV fluid.  Rectal  temperature of 100.  Code sepsis initiated.  Patient did report feeling improved with IV fluid and antibiotic.  10:55 AM Appreciate consultation from tried hospitalist, Dr. Caleb Popp who agrees to see and admit patient for further management of her condition.  Sepsis reassessment done.  Blood pressure did improve.  1:10 PM Pt was evaluated by [facility] but does not want to be admitted.  She felt comfortable going home.  She will be treated with ciprofloxacin and zofran. Return precaution given.   CRITICAL CARE Performed by: Fayrene Helper Total critical care time: 30 minutes Critical care time was exclusive of separately billable procedures and treating other patients. Critical care was necessary to treat or prevent imminent or life-threatening deterioration. Critical care was time spent personally by me on the following activities: development of treatment plan with patient and/or surrogate as well as nursing, discussions with consultants, evaluation of patient's response to treatment, examination of patient, obtaining history from patient or surrogate, ordering and performing treatments and interventions, ordering and review of laboratory studies, ordering and review of radiographic studies, pulse oximetry and re-evaluation of patient's condition.    Fayrene Helper, PA-C 11/09/17 1057    Fayrene Helper, PA-C 11/09/17 1310    Molpus, Jonny Ruiz, MD 11/09/17 2246

## 2017-11-09 NOTE — Discharge Instructions (Signed)
Take antibiotic as prescribed for the full duration.  Take zofran as needed for nausea.  Return if your condition worsen or if you have other concerns.  

## 2017-11-10 LAB — URINE CULTURE

## 2017-11-11 LAB — URINE CYTOLOGY ANCILLARY ONLY
Bacterial vaginitis: NEGATIVE
CANDIDA VAGINITIS: NEGATIVE

## 2017-11-14 LAB — CULTURE, BLOOD (ROUTINE X 2)
Culture: NO GROWTH
Culture: NO GROWTH
Special Requests: ADEQUATE
Special Requests: ADEQUATE

## 2017-11-23 ENCOUNTER — Ambulatory Visit (HOSPITAL_COMMUNITY)
Admission: EM | Admit: 2017-11-23 | Discharge: 2017-11-23 | Disposition: A | Discharge status: Home / Self Care | Payer: Self-pay | Attending: Family Medicine | Admitting: Family Medicine

## 2017-11-23 ENCOUNTER — Encounter (HOSPITAL_COMMUNITY): Payer: Self-pay | Admitting: Emergency Medicine

## 2017-11-23 ENCOUNTER — Other Ambulatory Visit: Payer: Self-pay

## 2017-11-23 DIAGNOSIS — Z87440 Personal history of urinary (tract) infections: Secondary | ICD-10-CM | POA: Insufficient documentation

## 2017-11-23 DIAGNOSIS — Z880 Allergy status to penicillin: Secondary | ICD-10-CM | POA: Insufficient documentation

## 2017-11-23 DIAGNOSIS — N898 Other specified noninflammatory disorders of vagina: Secondary | ICD-10-CM | POA: Insufficient documentation

## 2017-11-23 DIAGNOSIS — Z881 Allergy status to other antibiotic agents status: Secondary | ICD-10-CM | POA: Insufficient documentation

## 2017-11-23 DIAGNOSIS — Z79899 Other long term (current) drug therapy: Secondary | ICD-10-CM | POA: Insufficient documentation

## 2017-11-23 DIAGNOSIS — R300 Dysuria: Secondary | ICD-10-CM

## 2017-11-23 LAB — POCT URINALYSIS DIP (DEVICE)
Bilirubin Urine: NEGATIVE
Glucose, UA: NEGATIVE mg/dL
HGB URINE DIPSTICK: NEGATIVE
Ketones, ur: NEGATIVE mg/dL
Nitrite: NEGATIVE
PROTEIN: NEGATIVE mg/dL
Specific Gravity, Urine: 1.01 (ref 1.005–1.030)
UROBILINOGEN UA: 0.2 mg/dL (ref 0.0–1.0)
pH: 6.5 (ref 5.0–8.0)

## 2017-11-23 LAB — POCT PREGNANCY, URINE: Preg Test, Ur: NEGATIVE

## 2017-11-23 MED ORDER — FLUCONAZOLE 150 MG PO TABS: 150.0000 mg | ORAL_TABLET | Freq: Once | ORAL | 0 refills | Status: AC

## 2017-11-23 NOTE — ED Triage Notes (Signed)
Seen 2 weeks ago for a uti.  Prescribed med made patient sick.  Then seen at ed and changed antibiotic.  ED antibiotic was completed one week.  Patient says she never quite felt "well".  Symptoms improved, some resolved, some continued.  Currently feeling itchiness and urinating frequently

## 2017-11-23 NOTE — ED Provider Notes (Signed)
[facility]    CSN: [id number] Arrival date & time: 11/23/17  1005     History   Chief Complaint Chief Complaint  Patient presents with  . Recurrent UTI    HPI Nancy Blevins is a 27 y.o. female presenting today for evaluation of increased urinary frequency itching with urination.  Patient states that 2 weeks ago she was seen here and treated for UTI with Bactrim, the Bactrim made her feel sick, she went to the emergency room and was treated with Cipro.  She finished taking this medicine a week ago.  She states that the dysuria and pressure has improved; but itching and frequency have persisted.  She does state that she has vaginal discharge, but this is normal for her.  Denies fever, nausea, vomiting, abdominal pain.  HPI  History reviewed. No pertinent past medical history.  Patient Active Problem List   Diagnosis Date Noted  . Cystitis 11/08/2017  . ASTHMA 01/26/2007    Past Surgical History:  Procedure Laterality Date  . TONSILLECTOMY      OB History   None      Home Medications    Prior to Admission medications   Medication Sig Start Date End Date Taking? Authorizing Provider  CRANBERRY PO Take 1 tablet by mouth daily.    [provider]  escitalopram (LEXAPRO) 10 MG tablet Take 5-10 mg by mouth daily. 03/14/17   [provider]  fluconazole (DIFLUCAN) 150 MG tablet Take 1 tablet (150 mg total) by mouth once for 1 dose. 11/23/17 11/23/17  Loryn Haacke C, PA-C  Multiple Vitamin (MULTIVITAMIN WITH MINERALS) TABS tablet Take 1 tablet by mouth daily.    [provider]  NORETHINDRONE-ETH ESTRADIOL PO Take 1 tablet by mouth daily.    [provider]    Family History No family history on file.  Social History Social History   Tobacco Use  . Smoking status: Never Smoker  . Smokeless tobacco: Never Used  Substance Use Topics  . Alcohol use: Yes  . Drug use: No     Allergies   Cefdinir; Cefuroxime  axetil; Cefuroxime axetil; Cephalosporins; Penicillins; and Cefprozil   Review of Systems Review of Systems  Constitutional: Negative for fever.  Respiratory: Negative for shortness of breath.   Cardiovascular: Negative for chest pain.  Gastrointestinal: Negative for abdominal pain, diarrhea, nausea and vomiting.  Genitourinary: Positive for dysuria, frequency and vaginal discharge. Negative for flank pain, genital sores, hematuria, menstrual problem, vaginal bleeding and vaginal pain.  Musculoskeletal: Negative for back pain.  Skin: Negative for rash.  Neurological: Negative for dizziness, light-headedness and headaches.     Physical Exam Triage Vital Signs ED Triage Vitals  Enc Vitals Group     BP 11/23/17 1040 107/71     Pulse Rate 11/23/17 1040 89     Resp 11/23/17 1040 16     Temp 11/23/17 1040 98.7 F (37.1 C)     Temp Source 11/23/17 1040 Oral     SpO2 11/23/17 1040 99 %     Weight --      Height --      Head Circumference --      Peak Flow --      Pain Score 11/23/17 1038 0     Pain Loc --      Pain Edu? --      Excl. in GC? --    No data found.  Updated Vital Signs BP 107/71 (BP Location: Left Arm)  Pulse 89   Temp 98.7 F (37.1 C) (Oral)   Resp 16   SpO2 99%   Visual Acuity Right Eye Distance:   Left Eye Distance:   Bilateral Distance:    Right Eye Near:   Left Eye Near:    Bilateral Near:     Physical Exam  Constitutional: She appears well-developed and well-nourished. No distress.  HENT:  Head: Normocephalic and atraumatic.  Eyes: Conjunctivae are normal.  Neck: Neck supple.  Cardiovascular: Normal rate and regular rhythm.  No murmur heard. Pulmonary/Chest: Effort normal and breath sounds normal. No respiratory distress.  Abdominal: Soft. There is no tenderness.  Nontender light deep palpation throughout all 4 quadrants and epigastrium, negative CVA tenderness  Musculoskeletal: She exhibits no edema.  Neurological: She is alert.  Skin:  Skin is warm and dry.  Psychiatric: She has a normal mood and affect.  Nursing note and vitals reviewed.    UC Treatments / Results  Labs (all labs ordered are listed, but only abnormal results are displayed) Labs Reviewed  POCT URINALYSIS DIP (DEVICE) - Abnormal; Notable for the following components:      Result Value   Leukocytes, UA TRACE (*)    All other components within normal limits  URINE CULTURE  POCT PREGNANCY, URINE  CERVICOVAGINAL ANCILLARY ONLY    EKG None  Radiology No results found.  Procedures Procedures (including critical care time)  Medications Ordered in UC Medications - No data to display  Initial Impression / Assessment and Plan / UC Course  I have reviewed the triage vital signs and the nursing notes.  Pertinent labs & imaging results that were available during my care of the patient were reviewed by me and considered in my medical decision making (see chart for details).     Trace leuks, will send off for culture.  At this time we will not initiate antibiotics.  Will treat for yeast infection given itchiness and recent antibiotic use.  Vaginal swab obtained to confirm and to check for vaginal causes of itching and frequency.Discussed strict return precautions. Patient verbalized understanding and is agreeable with plan.  Final Clinical Impressions(s) / UC Diagnoses   Final diagnoses:  Dysuria     Discharge Instructions     Your urine did not show signs of infection today, we will send it off for culture to confirm.  I will go ahead and treat you for a yeast infection as cause of the itching with Diflucan.,  Please take 1 tablet today, may repeat in 3 days.  We are testing you for Gonorrhea, Chlamydia, Trichomonas, Yeast and Bacterial Vaginosis. We will call you if anything is positive and let you know if you require any further treatment. Please inform partners of any positive results.   Please return if symptoms not improving with  treatment, development of fever, nausea, vomiting, abdominal pain.    ED Prescriptions    Medication Sig Dispense Auth. Provider   fluconazole (DIFLUCAN) 150 MG tablet Take 1 tablet (150 mg total) by mouth once for 1 dose. 2 tablet Jenafer Winterton C, PA-C     Controlled Substance Prescriptions [place] Controlled Substance Registry consulted? Not Applicable   Lew DawesWieters, Andi Mahaffy C, [place]PA-C 11/23/17 1113

## 2017-11-23 NOTE — Discharge Instructions (Signed)
Your urine did not show signs of infection today, we will send it off for culture to confirm.  I will go ahead and treat you for a yeast infection as cause of the itching with Diflucan.,  Please take 1 tablet today, may repeat in 3 days.  We are testing you for Gonorrhea, Chlamydia, Trichomonas, Yeast and Bacterial Vaginosis. We will call you if anything is positive and let you know if you require any further treatment. Please inform partners of any positive results.   Please return if symptoms not improving with treatment, development of fever, nausea, vomiting, abdominal pain.

## 2017-11-24 LAB — URINE CULTURE

## 2017-11-24 LAB — CERVICOVAGINAL ANCILLARY ONLY
Bacterial vaginitis: NEGATIVE
Candida vaginitis: NEGATIVE
Chlamydia: NEGATIVE
Neisseria Gonorrhea: NEGATIVE
Trichomonas: NEGATIVE

## 2017-12-04 ENCOUNTER — Emergency Department (HOSPITAL_COMMUNITY): Payer: No Typology Code available for payment source

## 2017-12-04 ENCOUNTER — Other Ambulatory Visit: Payer: Self-pay

## 2017-12-04 ENCOUNTER — Encounter (HOSPITAL_COMMUNITY): Payer: Self-pay | Admitting: Emergency Medicine

## 2017-12-04 ENCOUNTER — Emergency Department (HOSPITAL_COMMUNITY)
Admission: EM | Admit: 2017-12-04 | Discharge: 2017-12-04 | Disposition: A | Discharge status: Home / Self Care | Payer: No Typology Code available for payment source | Attending: Emergency Medicine | Admitting: Emergency Medicine

## 2017-12-04 DIAGNOSIS — R079 Chest pain, unspecified: Secondary | ICD-10-CM | POA: Diagnosis not present

## 2017-12-04 DIAGNOSIS — Y9289 Other specified places as the place of occurrence of the external cause: Secondary | ICD-10-CM | POA: Diagnosis not present

## 2017-12-04 DIAGNOSIS — R0782 Intercostal pain: Secondary | ICD-10-CM | POA: Diagnosis not present

## 2017-12-04 DIAGNOSIS — Y998 Other external cause status: Secondary | ICD-10-CM | POA: Insufficient documentation

## 2017-12-04 DIAGNOSIS — J45909 Unspecified asthma, uncomplicated: Secondary | ICD-10-CM | POA: Insufficient documentation

## 2017-12-04 DIAGNOSIS — S161XXA Strain of muscle, fascia and tendon at neck level, initial encounter: Secondary | ICD-10-CM | POA: Diagnosis not present

## 2017-12-04 DIAGNOSIS — M549 Dorsalgia, unspecified: Secondary | ICD-10-CM | POA: Insufficient documentation

## 2017-12-04 DIAGNOSIS — Z79899 Other long term (current) drug therapy: Secondary | ICD-10-CM | POA: Insufficient documentation

## 2017-12-04 DIAGNOSIS — Y9389 Activity, other specified: Secondary | ICD-10-CM | POA: Insufficient documentation

## 2017-12-04 DIAGNOSIS — S199XXA Unspecified injury of neck, initial encounter: Secondary | ICD-10-CM | POA: Diagnosis present

## 2017-12-04 MED ORDER — METHOCARBAMOL 500 MG PO TABS: 500.0000 mg | ORAL_TABLET | Freq: Two times a day (BID) | ORAL | 0 refills | Status: DC

## 2017-12-04 MED ORDER — OXYCODONE-ACETAMINOPHEN 5-325 MG PO TABS
1.0000 | ORAL_TABLET | Freq: Once | ORAL | Status: DC
  Filled 2017-12-04: qty 1
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
## 2017-12-04 NOTE — ED Notes (Signed)
Patient transported to X-ray 
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
## 2018-01-02 ENCOUNTER — Encounter (HOSPITAL_COMMUNITY): Payer: Self-pay | Admitting: Emergency Medicine

## 2018-01-02 ENCOUNTER — Ambulatory Visit (HOSPITAL_COMMUNITY)
Admission: EM | Admit: 2018-01-02 | Discharge: 2018-01-02 | Disposition: A | Discharge status: Home / Self Care | Payer: Self-pay | Attending: Family Medicine | Admitting: Family Medicine

## 2018-01-02 DIAGNOSIS — R1013 Epigastric pain: Secondary | ICD-10-CM

## 2018-01-02 DIAGNOSIS — R109 Unspecified abdominal pain: Secondary | ICD-10-CM

## 2018-01-02 LAB — POCT URINALYSIS DIP (DEVICE)
Bilirubin Urine: NEGATIVE
GLUCOSE, UA: NEGATIVE mg/dL
Hgb urine dipstick: NEGATIVE
Ketones, ur: NEGATIVE mg/dL
Nitrite: NEGATIVE
PROTEIN: NEGATIVE mg/dL
SPECIFIC GRAVITY, URINE: 1.01 (ref 1.005–1.030)
UROBILINOGEN UA: 0.2 mg/dL (ref 0.0–1.0)
pH: 6 (ref 5.0–8.0)

## 2018-01-02 NOTE — ED Provider Notes (Signed)
[facility]    CSN: [id number] Arrival date & time: 01/02/18  1744     History   Chief Complaint Chief Complaint  Patient presents with  . Abdominal Pain    HPI Nancy Blevins is a 27 y.o. female.   27 year old who complains of abdominal pain fairly.  Really not so much pain is she feels a swelling in her epigastric area that radiates down to the pelvic area.  She has been here several times for similar symptoms.  She has had several urine cultures which grew out lactobacillus, felt not to be a urinary pathogen.  She was treated with Cipro in the hospital for same thing.  She denies any nausea vomiting change in bowels or urinary symptoms.  HPI  History reviewed. No pertinent past medical history.  Patient Active Problem List   Diagnosis Date Noted  . Cystitis 11/08/2017  . ASTHMA 01/26/2007    Past Surgical History:  Procedure Laterality Date  . TONSILLECTOMY      OB History   None      Home Medications    Prior to Admission medications   Medication Sig Start Date End Date Taking? Authorizing Provider  escitalopram (LEXAPRO) 10 MG tablet Take 5-10 mg by mouth daily. 03/14/17  Yes [provider]  Multiple Vitamin (MULTIVITAMIN WITH MINERALS) TABS tablet Take 1 tablet by mouth daily.   Yes [provider]  NORETHINDRONE-ETH ESTRADIOL PO Take 1 tablet by mouth daily.   Yes [provider]  CRANBERRY PO Take 1 tablet by mouth daily.    [provider]  methocarbamol (ROBAXIN) 500 MG tablet Take 1 tablet (500 mg total) by mouth 2 (two) times daily. 12/04/17   Kellie Shropshire, PA-C    Family History No family history on file.  Social History Social History   Tobacco Use  . Smoking status: Never Smoker  . Smokeless tobacco: Never Used  Substance Use Topics  . Alcohol use: Yes  . Drug use: No     Allergies   Cefdinir; Cefuroxime axetil; Cefuroxime axetil; Cephalosporins; Penicillins; and  Cefprozil   Review of Systems Review of Systems  Constitutional: Negative for chills and fever.  HENT: Negative for ear pain and sore throat.   Eyes: Negative for pain and visual disturbance.  Respiratory: Negative for cough and shortness of breath.   Cardiovascular: Negative for chest pain and palpitations.  Gastrointestinal: Positive for abdominal distention. Negative for abdominal pain and vomiting.  Genitourinary: Negative.  Negative for dysuria and hematuria.  Musculoskeletal: Negative for arthralgias and back pain.  Skin: Negative for color change and rash.  Neurological: Negative for seizures and syncope.  All other systems reviewed and are negative.    Physical Exam Triage Vital Signs ED Triage Vitals  Enc Vitals Group     BP 01/02/18 1754 117/83     Pulse Rate 01/02/18 1754 81     Resp 01/02/18 1754 16     Temp 01/02/18 1754 98.2 F (36.8 C)     Temp Source 01/02/18 1754 Oral     SpO2 01/02/18 1754 100 %     Weight 01/02/18 1758 120 lb (54.4 kg)     Height --      Head Circumference --      Peak Flow --      Pain Score 01/02/18 1758 2     Pain Loc --      Pain Edu? --      Excl. in GC? --  No data found.  Updated Vital Signs BP 117/83 (BP Location: Right Arm)   Pulse 81   Temp 98.2 F (36.8 C) (Oral)   Resp 16   Wt 120 lb (54.4 kg)   SpO2 100%   BMI 19.97 kg/m   Visual Acuity Right Eye Distance:   Left Eye Distance:   Bilateral Distance:    Right Eye Near:   Left Eye Near:    Bilateral Near:     Physical Exam  Constitutional: She appears well-developed and well-nourished.  Abdominal: Soft. Normal appearance, normal aorta and bowel sounds are normal. She exhibits no mass. There is no tenderness.     UC Treatments / Results  Labs (all labs ordered are listed, but only abnormal results are displayed) Labs Reviewed - No data to display  EKG None  Radiology No results found.  Procedures Procedures (including critical care  time)  Medications Ordered in UC Medications - No data to display  Initial Impression / Assessment and Plan / UC Course  I have reviewed the triage vital signs and the nursing notes.  Pertinent labs & imaging results that were available during my care of the patient were reviewed by me and considered in my medical decision making (see chart for details).     Abdominal pain.  Patient has concerns about aneurysms after some trauma to the abdomen last week.  Tried to reassure her that I do not feel aneurysm.  I can feel her aorta since she is not obese.  She is requesting abdominal ultrasound to rule out aneurysm. Final Clinical Impressions(s) / UC Diagnoses   Final diagnoses:  None   Discharge Instructions   None    ED Prescriptions    None     Controlled Substance Prescriptions [place] Controlled Substance Registry consulted? No   Frederica KusterMiller, Stephen M, MD 01/02/18 Ebony [id number]

## 2018-01-02 NOTE — ED Triage Notes (Signed)
PT noticed a "lump" in RUQ of abdomen. Has since been nauseated and has pain that radiates to genitals.

## 2018-01-03 ENCOUNTER — Ambulatory Visit (HOSPITAL_COMMUNITY)
Admission: RE | Admit: 2018-01-03 | Discharge: 2018-01-03 | Disposition: A | Discharge status: Home / Self Care | Payer: Self-pay | Source: Ambulatory Visit | Attending: Family Medicine | Admitting: Family Medicine

## 2018-01-03 ENCOUNTER — Other Ambulatory Visit (HOSPITAL_COMMUNITY): Payer: Self-pay | Admitting: Family Medicine

## 2018-01-03 DIAGNOSIS — R1900 Intra-abdominal and pelvic swelling, mass and lump, unspecified site: Secondary | ICD-10-CM

## 2018-01-17 ENCOUNTER — Telehealth (HOSPITAL_COMMUNITY): Payer: Self-pay

## 2018-01-17 NOTE — Telephone Encounter (Signed)
Pt called reporting still feeling like she has a lump in her abdomen after having negative ultrasound. Requesting that Dr. Hyacinth MeekerMiller order an abdominal Xray for her. Informed the patient that Dr. Hyacinth MeekerMiller is not here today and if she is having continued symptoms from a previous visit she would need to be seen again or establish care with a PCP.

## 2018-01-25 ENCOUNTER — Ambulatory Visit (HOSPITAL_COMMUNITY)
Admission: EM | Admit: 2018-01-25 | Discharge: 2018-01-25 | Disposition: A | Discharge status: Home / Self Care | Payer: Self-pay | Attending: Family Medicine | Admitting: Family Medicine

## 2018-01-25 ENCOUNTER — Encounter (HOSPITAL_COMMUNITY): Payer: Self-pay | Admitting: Emergency Medicine

## 2018-01-25 DIAGNOSIS — R1900 Intra-abdominal and pelvic swelling, mass and lump, unspecified site: Secondary | ICD-10-CM

## 2018-01-25 NOTE — ED Triage Notes (Signed)
Pt c/o lump on her stomach x1 month, seen here for the same.

## 2018-01-26 ENCOUNTER — Telehealth (HOSPITAL_COMMUNITY): Payer: Self-pay

## 2018-01-26 NOTE — Telephone Encounter (Signed)
Dr. Tracie HarrierHagler left a note for me to help the patient get set up for a CT scan. We can not order CT scans from this office per registration manager. Patient called and given information for financial assistance. Encouraged to refer to discharge paperwork with references to establishing a primary care.  Pt instructed to go to the ER with continued abdominal pain and swelling if unable to establish PCP. Verbalized understanding.

## 2018-01-31 ENCOUNTER — Emergency Department (HOSPITAL_COMMUNITY)
Admission: EM | Admit: 2018-01-31 | Discharge: 2018-01-31 | Disposition: A | Discharge status: Home / Self Care | Payer: No Typology Code available for payment source | Attending: Emergency Medicine | Admitting: Emergency Medicine

## 2018-01-31 ENCOUNTER — Encounter (HOSPITAL_COMMUNITY): Payer: Self-pay | Admitting: *Deleted

## 2018-01-31 ENCOUNTER — Emergency Department (HOSPITAL_COMMUNITY): Payer: No Typology Code available for payment source

## 2018-01-31 ENCOUNTER — Other Ambulatory Visit: Payer: Self-pay

## 2018-01-31 DIAGNOSIS — Z79899 Other long term (current) drug therapy: Secondary | ICD-10-CM | POA: Insufficient documentation

## 2018-01-31 DIAGNOSIS — R1013 Epigastric pain: Secondary | ICD-10-CM | POA: Diagnosis not present

## 2018-01-31 DIAGNOSIS — R112 Nausea with vomiting, unspecified: Secondary | ICD-10-CM | POA: Diagnosis not present

## 2018-01-31 DIAGNOSIS — J45909 Unspecified asthma, uncomplicated: Secondary | ICD-10-CM | POA: Diagnosis not present

## 2018-01-31 LAB — CBC
HCT: 40.2 % (ref 36.0–46.0)
HEMOGLOBIN: 13.2 g/dL (ref 12.0–15.0)
MCH: 29.4 pg (ref 26.0–34.0)
MCHC: 32.8 g/dL (ref 30.0–36.0)
MCV: 89.5 fL (ref 78.0–100.0)
Platelets: 328 10*3/uL (ref 150–400)
RBC: 4.49 MIL/uL (ref 3.87–5.11)
RDW: 12.6 % (ref 11.5–15.5)
WBC: 8.9 10*3/uL (ref 4.0–10.5)

## 2018-01-31 LAB — URINALYSIS, ROUTINE W REFLEX MICROSCOPIC
Bilirubin Urine: NEGATIVE
GLUCOSE, UA: NEGATIVE mg/dL
Hgb urine dipstick: NEGATIVE
KETONES UR: NEGATIVE mg/dL
Nitrite: NEGATIVE
Protein, ur: NEGATIVE mg/dL
Specific Gravity, Urine: 1.011 (ref 1.005–1.030)
pH: 5 (ref 5.0–8.0)

## 2018-01-31 LAB — COMPREHENSIVE METABOLIC PANEL
ALT: 14 U/L (ref 0–44)
ANION GAP: 8 (ref 5–15)
AST: 23 U/L (ref 15–41)
Albumin: 3.9 g/dL (ref 3.5–5.0)
Alkaline Phosphatase: 46 U/L (ref 38–126)
BUN: 9 mg/dL (ref 6–20)
CO2: 23 mmol/L (ref 22–32)
Calcium: 8.9 mg/dL (ref 8.9–10.3)
Chloride: 108 mmol/L (ref 98–111)
Creatinine, Ser: 0.53 mg/dL (ref 0.44–1.00)
GFR calc non Af Amer: >60 mL/min (ref >60)
Glucose, Bld: 84 mg/dL (ref 70–99)
Potassium: 3.8 mmol/L (ref 3.5–5.1)
SODIUM: 139 mmol/L (ref 135–145)
Total Bilirubin: 0.4 mg/dL (ref 0.3–1.2)
Total Protein: 7.5 g/dL (ref 6.5–8.1)

## 2018-01-31 LAB — LIPASE, BLOOD: LIPASE: 41 U/L (ref 11–51)

## 2018-01-31 LAB — I-STAT BETA HCG BLOOD, ED (MC, WL, AP ONLY)

## 2018-01-31 MED ORDER — IOPAMIDOL (ISOVUE-300) INJECTION 61%
100.0000 mL | Freq: Once | INTRAVENOUS | Status: AC | PRN
  Administered 2018-01-31: 100 mL via INTRAVENOUS

## 2018-01-31 MED ORDER — IOPAMIDOL (ISOVUE-300) INJECTION 61%
INTRAVENOUS | Status: AC
  Filled 2018-01-31: qty 100

## 2018-01-31 NOTE — ED Triage Notes (Signed)
Right upper abdominal pain that started today over an area that she reports that she has a lump in her stomach. Intermittent abdominal bloating and nausea. LBM today, reports as normal.

## 2018-01-31 NOTE — ED Provider Notes (Signed)
[facility]-EMERGENCY DEPT Provider Note   CSN: [id number] Arrival date & time: 01/31/18  1825     History   Chief Complaint Chief Complaint  Patient presents with  . Abdominal Pain    HPI Nancy Blevins is a 27 y.o. female with PMH/o asthma who presents for evaluation of abdominal pain.  Patient reports that pain began today.  She reports that over the last month, she has noticed a "lump in her abdomen."  She states that it was not causing her any discomfort.  She did have it evaluated at urgent care where a ultrasound of her aorta was negative for any concerns.  Patient reports that she was told if it would give her any pain, she needed to come to the ED to get a CT scan.  Patient reports that today, she started have some pain to that area.  She states that she notices in the upper abdomen.  She has pain comes and goes.  She has had some intermittent nausea but no vomiting.  She has been able to eat and drink without any difficulty.  Patient states that she has not sought any other evaluation for her symptoms.  Patient denies any chest pain, difficulty breathing, weight loss, fevers, night sweats, vomiting, dysuria, hematuria, vaginal bleeding, vaginal discharge.  Her last menstrual cycles proximally 1 week ago.  The history is provided by the patient.    History reviewed. No pertinent past medical history.  Patient Active Problem List   Diagnosis Date Noted  . Cystitis 11/08/2017  . ASTHMA 01/26/2007    Past Surgical History:  Procedure Laterality Date  . TONSILLECTOMY       OB History   None      Home Medications    Prior to Admission medications   Medication Sig Start Date End Date Taking? Authorizing Provider  escitalopram (LEXAPRO) 5 MG tablet Take 5 mg by mouth daily. 11/28/17  Yes [provider]  Multiple Vitamin (MULTIVITAMIN WITH MINERALS) TABS tablet Take 1 tablet by mouth daily.   Yes [provider]  TRI-SPRINTEC  0.18/0.215/0.25 MG-35 MCG tablet Take 1 tablet by mouth daily. 12/14/17  Yes [provider]  methocarbamol (ROBAXIN) 500 MG tablet Take 1 tablet (500 mg total) by mouth 2 (two) times daily. Patient not taking: Reported on 01/31/2018 12/04/17   Kellie Shropshire, PA-C    Family History No family history on file.  Social History Social History   Tobacco Use  . Smoking status: Never Smoker  . Smokeless tobacco: Never Used  Substance Use Topics  . Alcohol use: Yes  . Drug use: No     Allergies   Cefdinir; Cefuroxime axetil; Cefuroxime axetil; Cephalosporins; Penicillins; and Cefprozil   Review of Systems Review of Systems  Constitutional: Negative for diaphoresis, fever and unexpected weight change.  Respiratory: Negative for cough and shortness of breath.   Cardiovascular: Negative for chest pain.  Gastrointestinal: Positive for abdominal pain and nausea. Negative for vomiting.  Genitourinary: Negative for dysuria, hematuria and vaginal bleeding.  All other systems reviewed and are negative.    Physical Exam Updated Vital Signs BP 114/72 (BP Location: Left Arm)   Pulse 77   Temp 98.9 F (37.2 C) (Oral)   Resp 16   Ht 5\' 4"  (1.626 m)   Wt 58.5 kg   LMP 01/17/2018   SpO2 100%   BMI 22.14 kg/m   Physical Exam  Constitutional: She is oriented to person, place, and time. She  appears well-developed and well-nourished.  Sitting comfortably on examination table  HENT:  Head: Normocephalic and atraumatic.  Mouth/Throat: Oropharynx is clear and moist and mucous membranes are normal.  Eyes: Pupils are equal, round, and reactive to light. Conjunctivae, EOM and lids are normal. Right eye exhibits no discharge. Left eye exhibits no discharge. No scleral icterus.  Neck: Full passive range of motion without pain.  Cardiovascular: Normal rate, regular rhythm, normal heart sounds and normal pulses. Exam reveals no gallop and no friction rub.  No murmur  heard. Pulmonary/Chest: Effort normal and breath sounds normal.  Lungs clear to auscultation bilaterally.  Symmetric chest rise.  No wheezing, rales, rhonchi.  Abdominal: Soft. Normal appearance. There is tenderness in the epigastric area. There is no rigidity, no guarding and no CVA tenderness.  Abdomen soft, nondistended.  Tenderness palpation of the epigastric region.  No rigidity, guarding.  No CVA tenderness bilaterally. Small dime sized area of movable fatty tissue to the epigastric region. Does not appear to be a hernia. No overlying warmth, erythema.   Musculoskeletal: Normal range of motion.  Neurological: She is alert and oriented to person, place, and time.  Skin: Skin is warm and dry. Capillary refill takes less than 2 seconds.  Psychiatric: She has a normal mood and affect. Her speech is normal and behavior is normal.  Nursing note and vitals reviewed.    ED Treatments / Results  Labs (all labs ordered are listed, but only abnormal results are displayed) Labs Reviewed  URINALYSIS, ROUTINE W REFLEX MICROSCOPIC - Abnormal; Notable for the following components:      Result Value   Leukocytes, UA SMALL (*)    Bacteria, UA RARE (*)    All other components within normal limits  LIPASE, BLOOD  COMPREHENSIVE METABOLIC PANEL  CBC  I-STAT BETA HCG BLOOD, ED ([facility], [facility], [facility] ONLY)    EKG None  Radiology Ct Abdomen Pelvis W Contrast  Result Date: 01/31/2018 CLINICAL DATA:  Upper abdominal pain since yesterday. Mass and stomach. Intermittent bloating and nausea. EXAM: CT ABDOMEN AND PELVIS WITH CONTRAST TECHNIQUE: Multidetector CT imaging of the abdomen and pelvis was performed using the standard protocol following bolus administration of intravenous contrast. CONTRAST:  100mL ISOVUE-300 IOPAMIDOL (ISOVUE-300) INJECTION 61% COMPARISON:  Aortic ultrasound January 03, 2018 FINDINGS: LOWER CHEST: Lung bases are clear. Included heart size is normal. No pericardial effusion. HEPATOBILIARY: Liver  and gallbladder are normal. Elongated LEFT lobe of the liver is normal variant. PANCREAS: Normal. SPLEEN: Normal. ADRENALS/URINARY TRACT: Kidneys are orthotopic, demonstrating symmetric enhancement. No nephrolithiasis, hydronephrosis or solid renal masses. Too small to characterize hypodensity upper pole LEFT kidney. The unopacified ureters are normal in course and caliber. Urinary bladder is well distended and unremarkable. Normal adrenal glands. STOMACH/BOWEL: The stomach, small and large bowel are normal in course and caliber without inflammatory changes. Normal appendix. VASCULAR/LYMPHATIC: Aortoiliac vessels are normal in course and caliber. No lymphadenopathy by CT size criteria. 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 OTHER: Small amount of free fluid in the pelvis is likely physiologic. MUSCULOSKELETAL: Nonacute. IMPRESSION: 1. No acute intra-abdominal or pelvic process. 2. Elongated LEFT lobe of liver is normal variant, potentially accounting for palpable abnormality. 3. 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 Electronically Signed   By: Awilda Metroourtnay  Bloomer M.D.   On: 01/31/2018 22:12    Procedures Procedures (including critical care time)  Medications Ordered in ED Medications  iopamidol (ISOVUE-300) 61 % injection (has no administration in time range)  iopamidol (ISOVUE-300) 61 % injection 100 mL (100 mLs Intravenous Contrast Given 01/31/18 2144)     Initial Impression / Assessment and Plan / ED Course  I have reviewed the triage vital signs and the nursing notes.  Pertinent labs & imaging results that were available during my care of the patient were reviewed by me and considered in my medical decision making (see chart for details).     27 year old female who presents for evaluation of abdominal pain that began today.  Reports that she has noticed a lump in her abdomen over the last month.  Had an ultrasound done for  evaluation of aortic aneurysm which was negative.  Patient came to the ED because she started having pain today.  No fevers, weight loss, vomiting. Patient is afebrile, non-toxic appearing, sitting comfortably on examination table. Vital signs reviewed and stable. Consider lipoma vs intra abdominal abnormality. Exam is not concerning for a hernia. Initial labs ordered at triage.  Will plan for CT and pelvis.  CBC without any significant leukocytosis.  CMP without any abnormalities.  I-STAT beta negative.  Lipase unremarkable.  CT abdomen pelvis shows no acute intra-abdominal abnormality.  There is questionable septate versus bicornuate uterus.  Discussed results with patient.  Patient currently not having any pain at this time.  Vital signs are stable.  Patient stable for discharge at this time.  Encourage primary care follow-up.  Additionally, will give outpatient GI patient follow-up with if she has any other concerns. Patient had ample opportunity for questions and discussion. All patient's questions were answered with full understanding. Strict return precautions discussed. Patient expresses understanding and agreement to plan.    Final Clinical Impressions(s) / ED Diagnoses   Final diagnoses:  Epigastric pain    ED Discharge Orders    None       Rosana HoesLayden, Lauria Depoy A, PA-C 01/31/18 2349    Benjiman CorePickering, Nathan, MD 02/01/18 [phone]

## 2018-01-31 NOTE — Discharge Instructions (Signed)
Follow-up with the referred GI doctor as directed.  Return the emergency department for any worsening pain, fever, vomiting, difficulty breathing or any other worsening or concerning symptoms.

## 2018-02-01 NOTE — ED Provider Notes (Signed)
[facility]   [id number] 01/25/18 Arrival Time: 1755  ASSESSMENT & PLAN:  1. Abdominal wall swelling    For some reason, scheduling would not allow us to schedule a routine CT scan. No signs of a hernia. May proceed to the ED for imaging. Discussed that I do not think this is anything life-threatening.  Reviewed expectations re: course of current medical issues. Questions answered. Outlined signs and symptoms indicating need for more acute intervention. Patient verbalized understanding. After Visit Summary given.   SUBJECTIVE:  Nancy Blevins is a 27 y.o. female who has been here recently for "feeling something, a lump" near her stomach. Still present. U/S reportedly unremarkable. Not limiting her. Afebrile. No n/v. Normal bowl and bladder habits. Normal appetite. Weight stable. No injuries reported.   Patient's last menstrual period was 01/17/2018.   Past Surgical History:  Procedure Laterality Date  . TONSILLECTOMY      ROS: As per HPI.  OBJECTIVE:  Vitals:   01/25/18 1827  BP: 105/70  Pulse: 78  Resp: 16  Temp: 98.2 F (36.8 C)  SpO2: 100%    General appearance: alert; no distress Lungs: clear to auscultation bilaterally Heart: regular rate and rhythm Abdomen: soft; non-distended; I can appreciate a slight difference of L superior abdomen vs R; non-tender Back: no CVA tenderness; FROM at hips Extremities: no edema; symmetrical with no gross deformities Skin: warm and dry Neurologic: normal gait Psychological: alert and cooperative; normal mood and affect  Labs: Results for orders placed or performed during the hospital encounter of 01/02/18  POCT urinalysis dip (device)  Result Value Ref Range   Glucose, UA NEGATIVE NEGATIVE mg/dL   Bilirubin Urine NEGATIVE NEGATIVE   Ketones, ur NEGATIVE NEGATIVE mg/dL   Specific Gravity, Urine 1.010 1.005 - 1.030   Hgb urine dipstick NEGATIVE NEGATIVE   pH 6.0 5.0 - 8.0   Protein, ur NEGATIVE NEGATIVE  mg/dL   Urobilinogen, UA 0.2 0.0 - 1.0 mg/dL   Nitrite NEGATIVE NEGATIVE   Leukocytes, UA TRACE (A) NEGATIVE   Labs Reviewed - No data to display   Allergies  Allergen Reactions  . Cefdinir Other (See Comments)    dont remember   . Cefuroxime Axetil Other (See Comments)    dont remember  . Cefuroxime Axetil Other (See Comments)    dont remember  . Cephalosporins Other (See Comments)    Doesn't remember  . Penicillins     Childhood allergy Has patient had a PCN reaction causing immediate rash, facial/tongue/throat swelling, SOB or lightheadedness with hypotension: Unknown Has patient had a PCN reaction causing severe rash involving mucus membranes or skin necrosis: Unknown Has patient had a PCN reaction that required hospitalization: Unknown Has patient had a PCN reaction occurring within the last 10 years: Unknown If all of the above answers are "NO", then may proceed with Cephalosporin use.   Marland Kitchen. Cefprozil Rash                                                Social History   Socioeconomic History  . Marital status: Single    Spouse name: Not on file  . Number of children: Not on file  . Years of education: Not on file  . Highest education level: Not on file  Occupational History  . Occupation: Museum/gallery conservatorwaitress  Social Needs  . Financial resource strain:  Not on file  . Food insecurity:    Worry: Not on file    Inability: Not on file  . Transportation needs:    Medical: Not on file    Non-medical: Not on file  Tobacco Use  . Smoking status: Never Smoker  . Smokeless tobacco: Never Used  Substance and Sexual Activity  . Alcohol use: Yes  . Drug use: No  . Sexual activity: Yes    Birth control/protection: Condom  Lifestyle  . Physical activity:    Days per week: Not on file    Minutes per session: Not on file  . Stress: Not on file  Relationships  . Social connections:    Talks on phone: Not on file    Gets together: Not on file    Attends religious service: Not on  file    Active member of club or organization: Not on file    Attends meetings of clubs or organizations: Not on file    Relationship status: Not on file  . Intimate partner violence:    Fear of current or ex partner: Not on file    Emotionally abused: Not on file    Physically abused: Not on file    Forced sexual activity: Not on file  Other Topics Concern  . Not on file  Social History Narrative  . Not on file   No family history on file.   Mardella LaymanHagler, Kalayah Leske, MD 02/01/18 [phone]

## 2018-05-26 ENCOUNTER — Encounter (HOSPITAL_COMMUNITY): Payer: Self-pay | Admitting: Family Medicine

## 2018-05-26 ENCOUNTER — Other Ambulatory Visit: Payer: Self-pay

## 2018-05-26 ENCOUNTER — Ambulatory Visit (HOSPITAL_COMMUNITY)
Admission: EM | Admit: 2018-05-26 | Discharge: 2018-05-26 | Disposition: A | Discharge status: Home / Self Care | Payer: Self-pay | Attending: Family Medicine | Admitting: Family Medicine

## 2018-05-26 DIAGNOSIS — J069 Acute upper respiratory infection, unspecified: Secondary | ICD-10-CM | POA: Insufficient documentation

## 2018-05-26 MED ORDER — ALBUTEROL SULFATE HFA 108 (90 BASE) MCG/ACT IN AERS: 2.0000 | INHALATION_SPRAY | RESPIRATORY_TRACT | 1 refills | Status: DC | PRN

## 2018-05-26 MED ORDER — FLUTICASONE PROPIONATE 50 MCG/ACT NA SUSP: 2.0000 | Freq: Two times a day (BID) | NASAL | 12 refills | Status: AC

## 2018-05-26 MED ORDER — PREDNISONE 20 MG PO TABS: ORAL_TABLET | ORAL | 0 refills | Status: DC

## 2018-05-26 NOTE — ED Provider Notes (Signed)
[facility]    CSN: [id number] Arrival date & time: 05/26/18  1149     History   Chief Complaint Chief Complaint  Patient presents with  . Cough    HPI Nancy Blevins is a 27 y.o. female.   She is a non-smoker and has a childhood history of asthma.   27 year old established patient at home urgent care who presents with cough and chest tightness.  Her symptoms have been present for the last 2 days.  Patient denies fever or productive cough, rather, she has a dry cough.  She is not short of breath.  Her main problem is that she has significant nasal congestion.  She works as a IT consultantparalegal.     History reviewed. No pertinent past medical history.  Patient Active Problem List   Diagnosis Date Noted  . Cystitis 11/08/2017  . ASTHMA 01/26/2007    Past Surgical History:  Procedure Laterality Date  . TONSILLECTOMY      OB History   No obstetric history on file.      Home Medications    Prior to Admission medications   Medication Sig Start Date End Date Taking? Authorizing Provider  albuterol (PROVENTIL HFA;VENTOLIN HFA) 108 (90 Base) MCG/ACT inhaler Inhale 2 puffs into the lungs every 4 (four) hours as needed for wheezing or shortness of breath (cough, shortness of breath or wheezing.). 05/26/18   Elvina SidleLauenstein, Eyanna Mcgonagle, MD  escitalopram (LEXAPRO) 5 MG tablet Take 5 mg by mouth daily. 11/28/17   [provider]  fluticasone (FLONASE) 50 MCG/ACT nasal spray Place 2 sprays into both nostrils 2 (two) times daily. 05/26/18   Elvina SidleLauenstein, Eleora Sutherland, MD  Multiple Vitamin (MULTIVITAMIN WITH MINERALS) TABS tablet Take 1 tablet by mouth daily.    [provider]  predniSONE (DELTASONE) 20 MG tablet One daily with food 05/26/18   Elvina SidleLauenstein, Domonique Brouillard, MD  TRI-SPRINTEC 0.18/0.215/0.25 MG-35 MCG tablet Take 1 tablet by mouth daily. 12/14/17   [provider]    Family History History reviewed. No pertinent family history.  Social History Social History     Tobacco Use  . Smoking status: Never Smoker  . Smokeless tobacco: Never Used  Substance Use Topics  . Alcohol use: Yes  . Drug use: No     Allergies   Cefdinir; Cefuroxime axetil; Cefuroxime axetil; Cephalosporins; Penicillins; and Cefprozil   Review of Systems Review of Systems   Physical Exam Triage Vital Signs ED Triage Vitals  Enc Vitals Group     BP      Pulse      Resp      Temp      Temp src      SpO2      Weight      Height      Head Circumference      [place] Flow      Pain Score      Pain Loc      Pain Edu?      Excl. in GC?    No data found.  Updated Vital Signs BP 113/80 (BP Location: Left Arm)   Pulse 95   Temp (!) 96.6 F (35.9 C) (Oral)   Resp 18   SpO2 96%    Physical Exam Vitals signs and nursing note reviewed.  Constitutional:      Appearance: Normal appearance.  HENT:     Head: Normocephalic.     Nose: Congestion present.     Mouth/Throat:     Pharynx: Oropharynx is  clear.  Eyes:     Conjunctiva/sclera: Conjunctivae normal.  Cardiovascular:     Rate and Rhythm: Normal rate.     Heart sounds: Normal heart sounds.  Pulmonary:     Effort: Pulmonary effort is normal.     Breath sounds: Normal breath sounds.  Musculoskeletal: Normal range of motion.  Skin:    General: Skin is warm and dry.  Neurological:     General: No focal deficit present.     Mental Status: She is alert.  Psychiatric:        Mood and Affect: Mood normal.      UC Treatments / Results  Labs (all labs ordered are listed, but only abnormal results are displayed) Labs Reviewed - No data to display  EKG None  Radiology No results found.  Procedures Procedures (including critical care time)  Medications Ordered in UC Medications - No data to display  Initial Impression / Assessment and Plan / UC Course  I have reviewed the triage vital signs and the nursing notes.  Pertinent labs & imaging results that were available during my care of the  patient were reviewed by me and considered in my medical decision making (see chart for details).    Final Clinical Impressions(s) / UC Diagnoses   Final diagnoses:  Acute upper respiratory infection     Discharge Instructions     This appears to be a simple viral infection that should clear in the next 2 to 3 days.  Medicine provider will offer symptomatic relief, your immune system will take care of the infection and you do not need antibiotics.    ED Prescriptions    Medication Sig Dispense Auth. Provider   predniSONE (DELTASONE) 20 MG tablet One daily with food 5 tablet Elvina Sidle, MD   albuterol (PROVENTIL HFA;VENTOLIN HFA) 108 (90 Base) MCG/ACT inhaler Inhale 2 puffs into the lungs every 4 (four) hours as needed for wheezing or shortness of breath (cough, shortness of breath or wheezing.). 1 Inhaler Elvina Sidle, MD   fluticasone (FLONASE) 50 MCG/ACT nasal spray Place 2 sprays into both nostrils 2 (two) times daily. 16 g Elvina Sidle, MD     Controlled Substance Prescriptions [place] Controlled Substance Registry consulted? Not Applicable   Elvina Sidle, MD 05/26/18 1232

## 2018-05-26 NOTE — ED Triage Notes (Signed)
C/o dry cough  And nasal congestion

## 2018-05-26 NOTE — Discharge Instructions (Addendum)
This appears to be a simple viral infection that should clear in the next 2 to 3 days.  Medicine provider will offer symptomatic relief, your immune system will take care of the infection and you do not need antibiotics.

## 2019-06-29 ENCOUNTER — Ambulatory Visit: Payer: BLUE CROSS/BLUE SHIELD | Attending: Internal Medicine

## 2019-06-29 DIAGNOSIS — Z20822 Contact with and (suspected) exposure to covid-19: Secondary | ICD-10-CM

## 2019-06-30 LAB — NOVEL CORONAVIRUS, NAA: SARS-CoV-2, NAA: NOT DETECTED
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
## 2020-07-06 IMAGING — CT CT ABD-PELV W/ CM
2 of 4 series · 16 of 46 positions shown, 18 images · IV contrast (ISOVUE)
Comparison: Aortic ultrasound January 03, 2018

CLINICAL DATA: Upper abdominal pain since yesterday. Mass and
stomach. Intermittent bloating and nausea.

EXAM:
CT ABDOMEN AND PELVIS WITH CONTRAST
TECHNIQUE: Multidetector CT imaging of the abdomen and pelvis was performed
using the standard protocol following bolus administration of
intravenous contrast.
CONTRAST:  100mL ILGRCY-8JJ IOPAMIDOL (ILGRCY-8JJ) INJECTION 61%

[Series 2: axial st · axial · 0.77mm/px · z∈[-552,-146]mm · 13 of 91 slices shown, 15 images]
[im 5/91  soft-tissue]
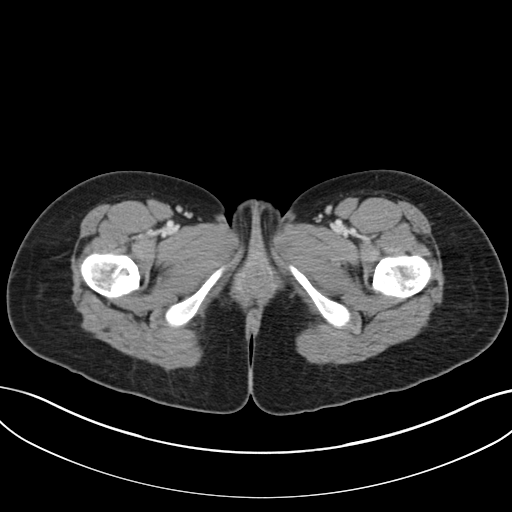
[im 5/91  bone]
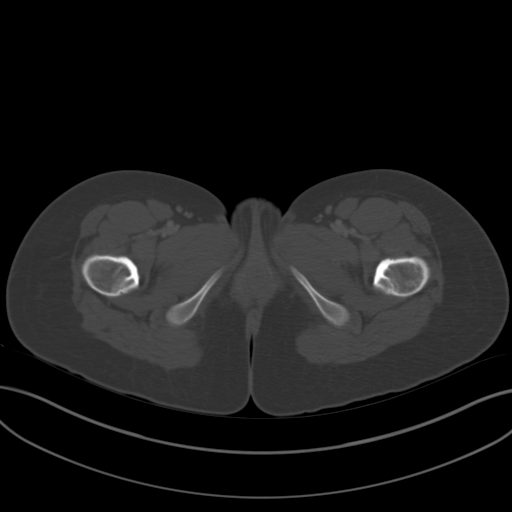
[im 15/91  soft-tissue]
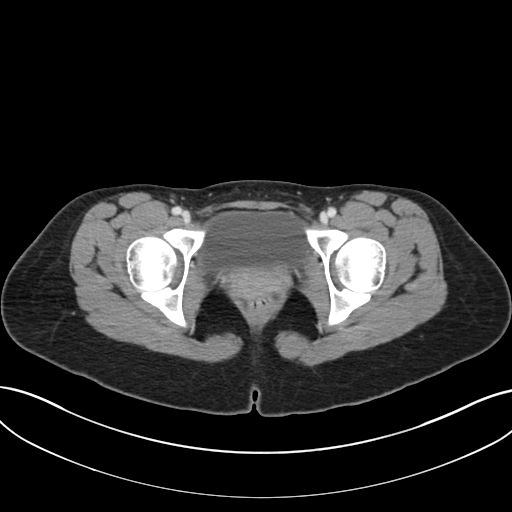
[im 19/91  soft-tissue]
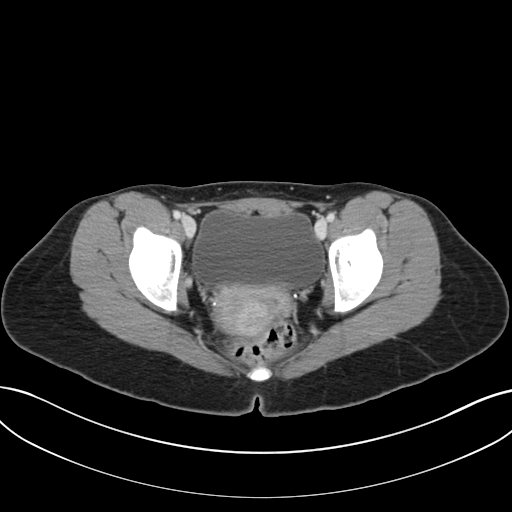
[im 24/91  soft-tissue]
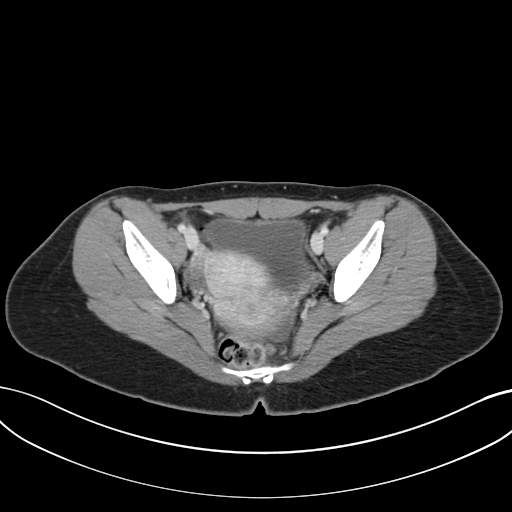
[im 34/91  soft-tissue]
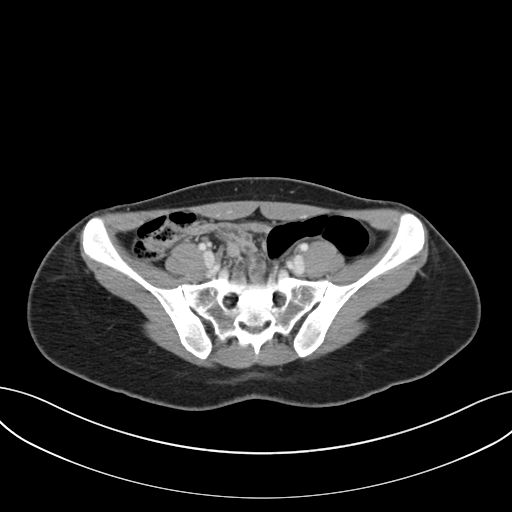
[im 38/91  soft-tissue]
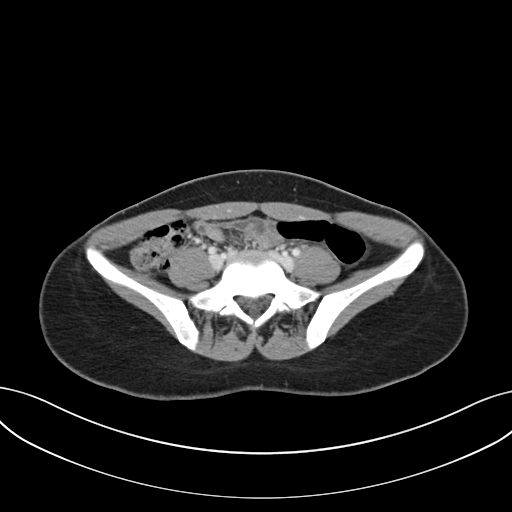
[im 48/91  soft-tissue]
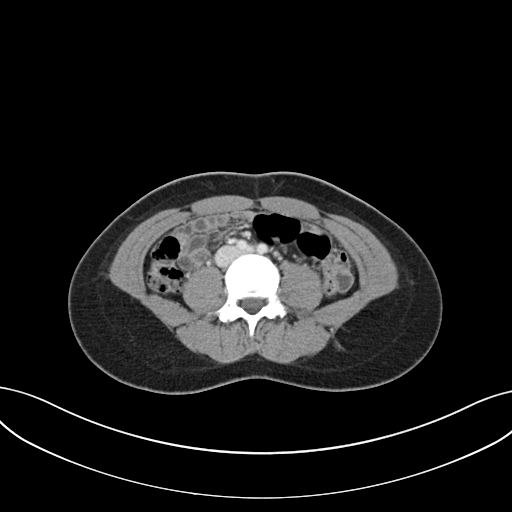
[im 53/91  soft-tissue]
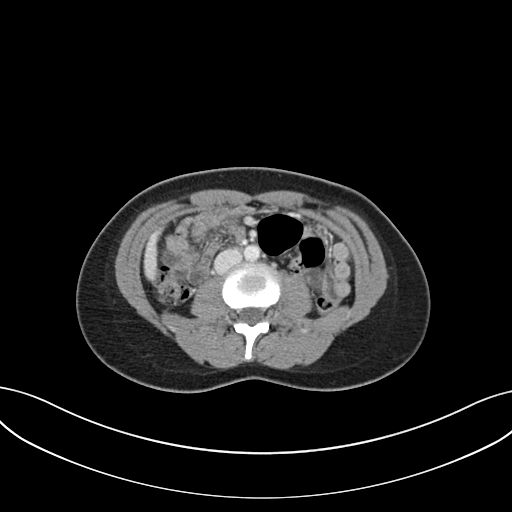
[im 57/91  soft-tissue]
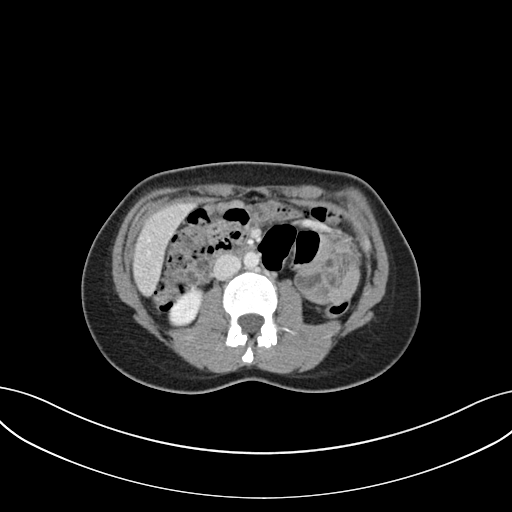
[im 57/91  bone]
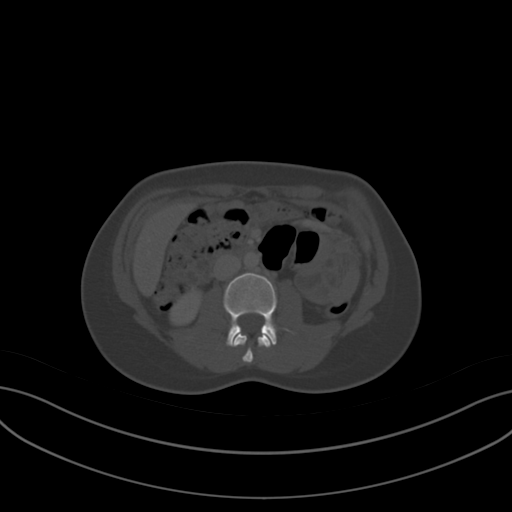
[im 67/91  soft-tissue]
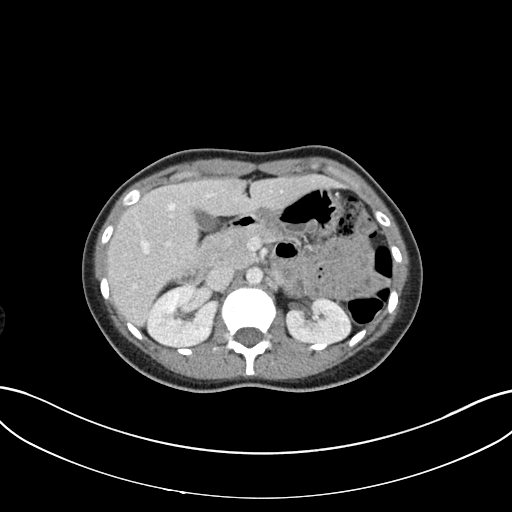
[im 72/91  soft-tissue]
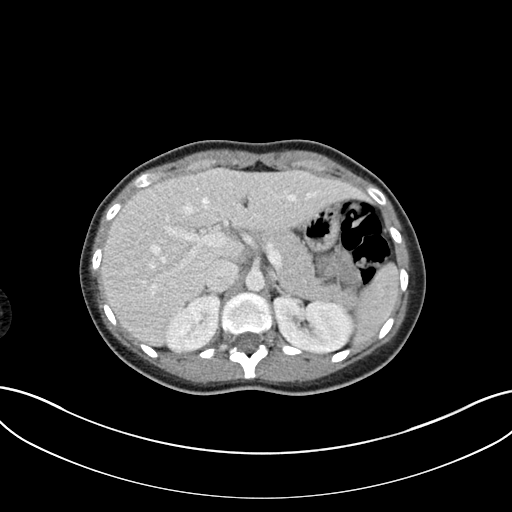
[im 76/91  soft-tissue]
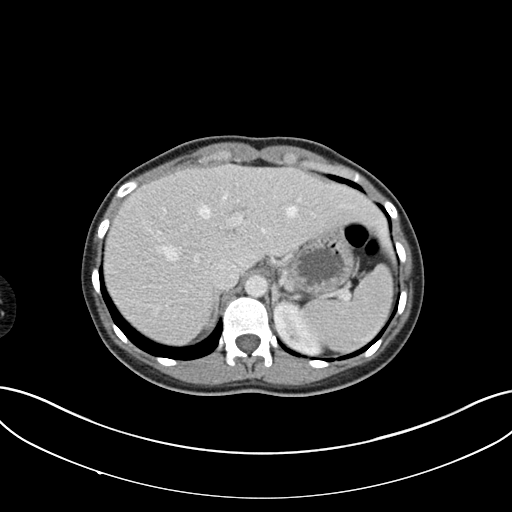
[im 86/91  soft-tissue]
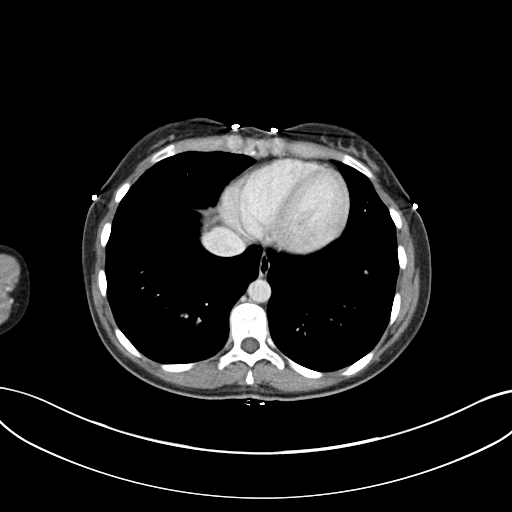

[Series 5: coronal st · coronal · 0.66mm/px · 3 of 78 slices shown]
[im 26/78  soft-tissue]
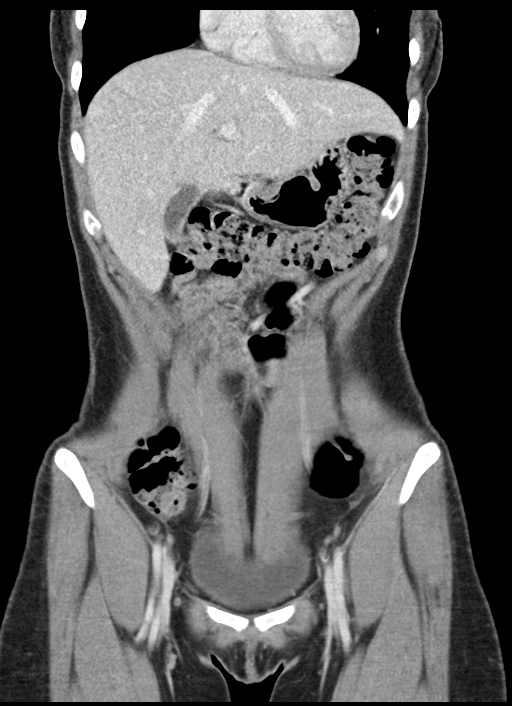
[im 35/78  soft-tissue]
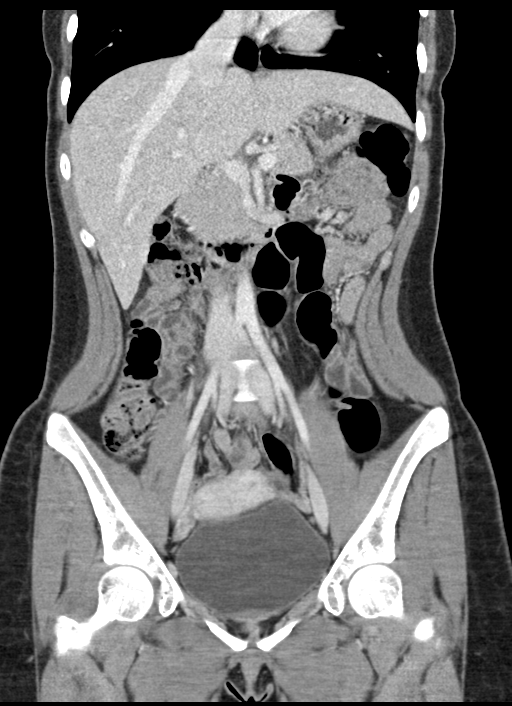
[im 43/78  soft-tissue]
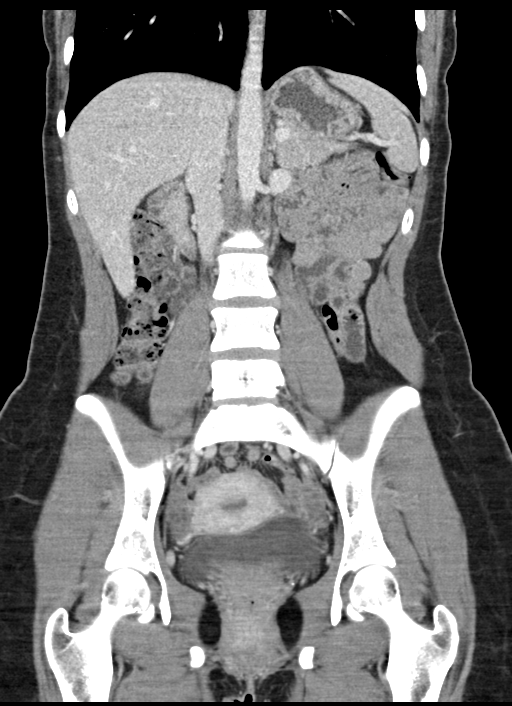

[16 of 46 positions shown; findings below may reference images not displayed]

FINDINGS: LOWER CHEST: Lung bases are clear. Included heart size is normal. No
pericardial effusion.

HEPATOBILIARY: Liver and gallbladder are normal. Elongated LEFT lobe
of the liver is normal variant.

PANCREAS: Normal.

SPLEEN: Normal.

ADRENALS/URINARY TRACT: Kidneys are orthotopic, demonstrating
symmetric enhancement. No nephrolithiasis, hydronephrosis or solid
renal masses. Too small to characterize hypodensity upper pole LEFT
kidney. The unopacified ureters are normal in course and caliber.
Urinary bladder is well distended and unremarkable. Normal adrenal
glands.

STOMACH/BOWEL: The stomach, small and large bowel are normal in
course and caliber without inflammatory changes. Normal appendix.

VASCULAR/LYMPHATIC: Aortoiliac vessels are normal in course and
caliber. No lymphadenopathy by CT size criteria.

REPRODUCTIVE: Septate versus bicornuate uterus.

OTHER: Small amount of free fluid in the pelvis is likely
physiologic.

MUSCULOSKELETAL: Nonacute.
IMPRESSION: 1. No acute intra-abdominal or pelvic process.
2. Elongated LEFT lobe of liver is normal variant, potentially
accounting for palpable abnormality.
3. Septate versus bicornuate uterus would be better characterized on
non emergent pelvic ultrasound.

## 2020-07-17 ENCOUNTER — Encounter: Payer: Self-pay | Admitting: Nurse Practitioner

## 2020-07-17 ENCOUNTER — Ambulatory Visit (INDEPENDENT_AMBULATORY_CARE_PROVIDER_SITE_OTHER): Payer: Managed Care, Other (non HMO) | Admitting: Nurse Practitioner

## 2020-07-17 ENCOUNTER — Other Ambulatory Visit: Payer: Self-pay

## 2020-07-17 VITALS — BP 104/64 | HR 68 | Resp 16 | Ht 65.0 in | Wt 127.0 lb

## 2020-07-17 DIAGNOSIS — Z01419 Encounter for gynecological examination (general) (routine) without abnormal findings: Secondary | ICD-10-CM | POA: Diagnosis not present

## 2020-07-17 DIAGNOSIS — Z8342 Family history of familial hypercholesterolemia: Secondary | ICD-10-CM | POA: Diagnosis not present

## 2020-07-17 DIAGNOSIS — Z113 Encounter for screening for infections with a predominantly sexual mode of transmission: Secondary | ICD-10-CM

## 2020-07-17 DIAGNOSIS — Z3041 Encounter for surveillance of contraceptive pills: Secondary | ICD-10-CM | POA: Diagnosis not present

## 2020-07-17 MED ORDER — LO LOESTRIN FE 1 MG-10 MCG / 10 MCG PO TABS: 1.0000 | ORAL_TABLET | Freq: Every day | ORAL | 12 refills | Status: DC

## 2020-07-17 NOTE — Patient Instructions (Signed)
Health Maintenance, Female Adopting a healthy lifestyle and getting preventive care are important in promoting health and wellness. Ask your health care provider about:  The right schedule for you to have regular tests and exams.  Things you can do on your own to prevent diseases and keep yourself healthy. What should I know about diet, weight, and exercise? Eat a healthy diet  Eat a diet that includes plenty of vegetables, fruits, low-fat dairy products, and lean protein.  Do not eat a lot of foods that are high in solid fats, added sugars, or sodium.   Maintain a healthy weight Body mass index (BMI) is used to identify weight problems. It estimates body fat based on height and weight. Your health care provider can help determine your BMI and help you achieve or maintain a healthy weight. Get regular exercise Get regular exercise. This is one of the most important things you can do for your health. Most adults should:  Exercise for at least 150 minutes each week. The exercise should increase your heart rate and make you sweat (moderate-intensity exercise).  Do strengthening exercises at least twice a week. This is in addition to the moderate-intensity exercise.  Spend less time sitting. Even light physical activity can be beneficial. Watch cholesterol and blood lipids Have your blood tested for lipids and cholesterol at 29 years of age, then have this test every 5 years. Have your cholesterol levels checked more often if:  Your lipid or cholesterol levels are high.  You are older than 29 years of age.  You are at high risk for heart disease. What should I know about cancer screening? Depending on your health history and family history, you may need to have cancer screening at various ages. This may include screening for:  Breast cancer.  Cervical cancer.  Colorectal cancer.  Skin cancer.  Lung cancer. What should I know about heart disease, diabetes, and high blood  pressure? Blood pressure and heart disease  High blood pressure causes heart disease and increases the risk of stroke. This is more likely to develop in people who have high blood pressure readings, are of African descent, or are overweight.  Have your blood pressure checked: ? Every 3-5 years if you are 18-39 years of age. ? Every year if you are 40 years old or older. Diabetes Have regular diabetes screenings. This checks your fasting blood sugar level. Have the screening done:  Once every three years after age 40 if you are at a normal weight and have a low risk for diabetes.  More often and at a younger age if you are overweight or have a high risk for diabetes. What should I know about preventing infection? Hepatitis B If you have a higher risk for hepatitis B, you should be screened for this virus. Talk with your health care provider to find out if you are at risk for hepatitis B infection. Hepatitis C Testing is recommended for:  Everyone born from 1945 through 1965.  Anyone with known risk factors for hepatitis C. Sexually transmitted infections (STIs)  Get screened for STIs, including gonorrhea and chlamydia, if: ? You are sexually active and are younger than 29 years of age. ? You are older than 29 years of age and your health care provider tells you that you are at risk for this type of infection. ? Your sexual activity has changed since you were last screened, and you are at increased risk for chlamydia or gonorrhea. Ask your health care provider   if you are at risk.  Ask your health care provider about whether you are at high risk for HIV. Your health care provider may recommend a prescription medicine to help prevent HIV infection. If you choose to take medicine to prevent HIV, you should first get tested for HIV. You should then be tested every 3 months for as long as you are taking the medicine. Pregnancy  If you are about to stop having your period (premenopausal) and  you may become pregnant, seek counseling before you get pregnant.  Take 400 to 800 micrograms (mcg) of folic acid every day if you become pregnant.  Ask for birth control (contraception) if you want to prevent pregnancy. Osteoporosis and menopause Osteoporosis is a disease in which the bones lose minerals and strength with aging. This can result in bone fractures. If you are 65 years old or older, or if you are at risk for osteoporosis and fractures, ask your health care provider if you should:  Be screened for bone loss.  Take a calcium or vitamin D supplement to lower your risk of fractures.  Be given hormone replacement therapy (HRT) to treat symptoms of menopause. Follow these instructions at home: Lifestyle  Do not use any products that contain nicotine or tobacco, such as cigarettes, e-cigarettes, and chewing tobacco. If you need help quitting, ask your health care provider.  Do not use street drugs.  Do not share needles.  Ask your health care provider for help if you need support or information about quitting drugs. Alcohol use  Do not drink alcohol if: ? Your health care provider tells you not to drink. ? You are pregnant, may be pregnant, or are planning to become pregnant.  If you drink alcohol: ? Limit how much you use to 0-1 drink a day. ? Limit intake if you are breastfeeding.  Be aware of how much alcohol is in your drink. In the [place] one drink equals one 12 oz bottle of beer (355 mL), one 5 oz glass of wine (148 mL), or one 1 oz glass of hard liquor (44 mL). General instructions  Schedule regular health, dental, and eye exams.  Stay current with your vaccines.  Tell your health care provider if: ? You often feel depressed. ? You have ever been abused or do not feel safe at home. Summary  Adopting a healthy lifestyle and getting preventive care are important in promoting health and wellness.  Follow your health care provider's instructions about healthy  diet, exercising, and getting tested or screened for diseases.  Follow your health care provider's instructions on monitoring your cholesterol and blood pressure. This information is not intended to replace advice given to you by your health care provider. Make sure you discuss any questions you have with your health care provider. Document Revised: 05/24/2018 Document Reviewed: 05/24/2018 Elsevier Patient Education  2021 Elsevier Inc.  

## 2020-07-17 NOTE — Progress Notes (Signed)
30 y.o. G0P0000 Married [place] female here for annual exam.   Was on Loloestrin, but changed to [employer] 24 r/t cost. Would like to go back to Lo loestrin. In past when she took the lower dose she noticed she was less moody.  Has never had any STD testing, would like today. Not really symptomatic but has what she considers to be increased discharge, no irritation   Married in September. 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  Plans children in the next couple years. Works out regularly, running or incline.  No LMP recorded. (Menstrual status: Oral contraceptives).          Sexually active: Yes.    The current method of family planning is OCP (estrogen/progesterone).    Exercising: Yes.    2-3 times a week Smoker:  no  Health Maintenance: Pap:  Maybe summer 2021 History of abnormal Pap:  no MMG:  none Colonoscopy:  none BMD:   none TDaP:  Maybe 2011 Gardasil:   completed Covid-19: pfizer Hep C testing: not done Screening Labs: will check cholesterol today r/t Dad being diagnosed with high cholesterol at a very young age   reports that she has never smoked. She has never used smokeless tobacco. She reports previous alcohol use. She reports that she does not use drugs.  Past Medical History:  Diagnosis Date  . Anxiety   . Asthma     Past Surgical History:  Procedure Laterality Date  . TONSILLECTOMY AND ADENOIDECTOMY      Current Outpatient Medications  Medication Sig Dispense Refill  . escitalopram (LEXAPRO) 5 MG tablet Take 5 mg by mouth daily.  1  . fluticasone (FLONASE) 50 MCG/ACT nasal spray Place 2 sprays into both nostrils 2 (two) times daily. 16 g 12  . Multiple Vitamin (MULTIVITAMIN WITH MINERALS) TABS tablet Take 1 tablet by mouth daily.    . norethindrone-ethinyl estradiol (LOESTRIN FE) 1-20 MG-MCG tablet Take 1 tablet by mouth daily.     No current facility-administered medications for this visit.    Family History  Problem Relation Age of Onset  .  Hypertension Father     Review of Systems  Constitutional: Negative.   HENT: Negative.   Eyes: Negative.   Respiratory: Negative.   Cardiovascular: Negative.   Gastrointestinal: Negative.   Endocrine: Negative.   Genitourinary: Negative.   Musculoskeletal: Negative.   Skin: Negative.   Allergic/Immunologic: Negative.   Neurological: Negative.   Hematological: Negative.   Psychiatric/Behavioral: Negative.     Exam:   BP 104/64   Pulse 68   Resp 16   Ht 5\' 5"  (1.651 m)   Wt 127 lb (57.6 kg)   BMI 21.13 kg/m   Height: 5\' 5"  (165.1 cm)  General appearance: alert, cooperative and appears stated age, no acute distress Head: Normocephalic, without obvious abnormality Neck: no adenopathy, thyroid normal to inspection and palpation Lungs: clear to auscultation bilaterally Breasts: No axillary or supraclavicular adenopathy, Normal to palpation without dominant masses Heart: regular rate and rhythm Abdomen: soft, non-tender; no masses,  no organomegaly Extremities: extremities normal, no edema Skin: No rashes or lesions Lymph nodes: Cervical, supraclavicular, and axillary nodes normal. No abnormal inguinal nodes palpated Neurologic: Grossly normal   Pelvic: External genitalia:  no lesions              Urethra:  normal appearing urethra with no masses, tenderness or lesions              Bartholins and Skenes: normal  Vagina: normal appearing vagina, appropriate for age, normal appearing discharge, no lesions              Cervix: neg cervical motion tenderness, no visible lesions, ectropic             Bimanual Exam:   Uterus:  normal size, contour, position, consistency, mobility, non-tender              Adnexa: no mass, fullness, tenderness                 Joy, CMA Chaperone was present for exam.  A:  Well woman exam with routine gynecological exam  Surveillance of previously prescribed contraceptive pill - Plan: Norethindrone-Ethinyl Estradiol-Fe Biphas  (LO LOESTRIN FE) 1 MG-10 MCG / 10 MCG tablet  Screen for STD (sexually transmitted disease) - Plan: C. trachomatis/N. gonorrhoeae RNA, HIV Antibody (routine testing w rflx), RPR  Family history of high cholesterol - Plan: Lipid Profile    P:   Pap :done 12/13/2018, WNL (at [facility], report in body of 12/13/2018 note).  Next pap due 2023  Labs:as above  Medications: change to LoLoestrin per request

## 2020-07-18 LAB — LIPID PANEL
Cholesterol: 218 mg/dL — ABNORMAL HIGH (ref <200)
HDL: 86 mg/dL (ref >=50)
LDL Cholesterol (Calc): 114 mg/dL (calc) — ABNORMAL HIGH
Non-HDL Cholesterol (Calc): 132 mg/dL (calc) — ABNORMAL HIGH (ref <130)
Total CHOL/HDL Ratio: 2.5 (calc) (ref <5.0)
Triglycerides: 81 mg/dL (ref <150)

## 2020-07-18 LAB — C. TRACHOMATIS/N. GONORRHOEAE RNA
C. trachomatis RNA, TMA: NOT DETECTED
N. gonorrhoeae RNA, TMA: NOT DETECTED

## 2020-07-18 LAB — HIV ANTIBODY (ROUTINE TESTING W REFLEX): HIV 1&2 Ab, 4th Generation: NONREACTIVE

## 2020-07-18 LAB — RPR: RPR Ser Ql: NONREACTIVE

## 2021-07-21 ENCOUNTER — Other Ambulatory Visit: Payer: Self-pay

## 2021-07-21 DIAGNOSIS — Z3041 Encounter for surveillance of contraceptive pills: Secondary | ICD-10-CM

## 2021-07-21 MED ORDER — LO LOESTRIN FE 1 MG-10 MCG / 10 MCG PO TABS: 1.0000 | ORAL_TABLET | Freq: Every day | ORAL | 0 refills | Status: DC

## 2021-07-21 NOTE — Telephone Encounter (Signed)
07/17/2020 last AEX Clarita Crane, NP  07/30/2021 Scheduled AEX with Valetta Close , NP

## 2021-07-29 NOTE — Progress Notes (Signed)
° °  Nancy Blevins 1990-09-07 [id number]   History:  31 y.o. G0 presents for annual exam.  Gynecologic History No LMP recorded. (Menstrual status: Oral contraceptives). Period Duration (Days): amenorrhea with continous ocps Contraception/Family planning: OCP (estrogen/progesterone) Sexually active: yes Last Pap: 2021. Results were: normal   Obstetric History OB History  Gravida Para Term Preterm AB Living  0 0 0 0 0 0  SAB IAB Ectopic Multiple Live Births  0 0 0 0 0     The following portions of the patient's history were reviewed and updated as appropriate: allergies, current medications, past family history, past medical history, past social history, past surgical history, and problem list.  Review of Systems Pertinent items noted in HPI and remainder of comprehensive ROS otherwise negative.   Past medical history, past surgical history, family history and social history were all reviewed and documented in the EPIC chart.   Exam:  Vitals:   07/30/28 1554  BP: 114/68  Weight: 134 lb (60.8 kg)  Height: 5\' 5"  (1.651 m)   Body mass index is 22.3 kg/m.  General appearance:  Normal Thyroid:  Symmetrical, normal in size, without palpable masses or nodularity. Respiratory  Auscultation:  Clear without wheezing or rhonchi Cardiovascular  Auscultation:  Regular rate, without rubs, murmurs or gallops  Edema/varicosities:  Not grossly evident Abdominal  Soft,nontender, without masses, guarding or rebound.  Liver/spleen:  No organomegaly noted  Hernia:  None appreciated  Skin  Inspection:  Grossly normal Breasts: Examined lying and sitting.   Right: Without masses, retractions, nipple discharge or axillary adenopathy.   Left: Without masses, retractions, nipple discharge or axillary adenopathy. Genitourinary   Inguinal/mons:  Normal without inguinal adenopathy  External genitalia:  Normal appearing vulva with no masses, tenderness, or lesions  BUS/Urethra/Skene's  glands:  Normal without masses or exudate  Vagina:  Normal appearing with normal color and discharge, no lesions  Cervix:  ectropion, friable, pap obtained  Uterus:  Normal in size, shape and contour.  Mobile, nontender  Adnexa/parametria:     Rt: Normal in size, without masses or tenderness.   Lt: Normal in size, without masses or tenderness.  Anus and perineum: Normal  Digital rectal exam: Normal sphincter tone without palpated masses or tenderness  Patient informed chaperone available to be present for breast and pelvic exam. Patient has requested no chaperone to be present. Patient has been advised what will be completed during breast and pelvic exam.   Assessment/Plan:   -Well woman exam -Pap with reflex testing  -Continue LoLoestrin- rx sent -Mammo at 41   Discussed SBE, pap screening as directed/appropriate. Recommend 152mins of exercise weekly, including weight bearing exercise. Encouraged the use of seatbelts and sunscreen. Return in 1 year for annual or as needed.   Rubbie Battiest B WHNP-BC 4:15 PM 07/30/2021

## 2021-07-30 ENCOUNTER — Other Ambulatory Visit: Payer: Self-pay

## 2021-07-30 ENCOUNTER — Ambulatory Visit (INDEPENDENT_AMBULATORY_CARE_PROVIDER_SITE_OTHER): Payer: 59 | Admitting: Radiology

## 2021-07-30 ENCOUNTER — Encounter: Payer: Self-pay | Admitting: Radiology

## 2021-07-30 ENCOUNTER — Other Ambulatory Visit (HOSPITAL_COMMUNITY)
Admission: RE | Admit: 2021-07-30 | Discharge: 2021-07-30 | Disposition: A | Discharge status: Home / Self Care | Payer: Self-pay | Source: Ambulatory Visit | Attending: Radiology | Admitting: Radiology

## 2021-07-30 VITALS — BP 114/68 | Ht 65.0 in | Wt 134.0 lb

## 2021-07-30 DIAGNOSIS — Z01419 Encounter for gynecological examination (general) (routine) without abnormal findings: Secondary | ICD-10-CM | POA: Diagnosis not present

## 2021-07-30 DIAGNOSIS — Z3041 Encounter for surveillance of contraceptive pills: Secondary | ICD-10-CM | POA: Diagnosis not present

## 2021-07-30 MED ORDER — LO LOESTRIN FE 1 MG-10 MCG / 10 MCG PO TABS: 1.0000 | ORAL_TABLET | Freq: Every day | ORAL | 11 refills | Status: DC

## 2021-07-31 LAB — CYTOLOGY - PAP: Diagnosis: NEGATIVE

## 2021-08-04 ENCOUNTER — Encounter: Payer: Self-pay | Admitting: Radiology

## 2021-08-04 ENCOUNTER — Other Ambulatory Visit: Payer: Self-pay

## 2021-08-04 ENCOUNTER — Ambulatory Visit (INDEPENDENT_AMBULATORY_CARE_PROVIDER_SITE_OTHER): Payer: 59 | Admitting: Radiology

## 2021-08-04 VITALS — BP 102/74 | Ht 65.0 in | Wt 137.0 lb

## 2021-08-04 DIAGNOSIS — R300 Dysuria: Secondary | ICD-10-CM

## 2021-08-04 MED ORDER — NITROFURANTOIN MONOHYD MACRO 100 MG PO CAPS: 100.0000 mg | ORAL_CAPSULE | Freq: Two times a day (BID) | ORAL | 0 refills | Status: DC

## 2021-08-04 NOTE — Progress Notes (Signed)
° ° ° ° °  SUBJECTIVE: Nancy Blevins is a 31 y.o. female who complains of urinary frequency, urgency and dysuria x 1 days, without flank pain, fever, chills, or abnormal vaginal discharge or bleeding.   OBJECTIVE: Appears well, in no apparent distress.  Vital signs are normal. The abdomen is soft without tenderness, guarding, mass, rebound or organomegaly. No CVA tenderness or inguinal adenopathy noted. Urine dipstick shows positive for RBC's and positive for leukocytes.  Micro exam: 40-60 WBC's per HPF, 10-20 RBC's per HPF, and moderate + bacteria.   Chaperone offered and declined for exam.  ASSESSMENT: UTI; uncomplicated without evidence of pyelonephritis  PLAN: UTI Macrobid po BID x 7 days  Will send urine culture and sensitivity.  Treatment per orders - also push fluids, avoid bladder irritants. Instructed she may use Pyridium OTC prn. Call or return to clinic prn if these symptoms worsen or fail to improve as anticipated. Pyelo precautions reviewed with patient.

## 2021-08-06 LAB — URINALYSIS, COMPLETE W/RFL CULTURE
Bilirubin Urine: NEGATIVE
Glucose, UA: NEGATIVE
Hyaline Cast: NONE SEEN /LPF
Ketones, ur: NEGATIVE
Nitrites, Initial: NEGATIVE
Protein, ur: NEGATIVE
Specific Gravity, Urine: 1.01 (ref 1.001–1.035)
pH: 5.5 (ref 5.0–8.0)

## 2021-08-06 LAB — URINE CULTURE
MICRO NUMBER:: 13036736
SPECIMEN QUALITY:: ADEQUATE

## 2021-08-06 LAB — CULTURE INDICATED

## 2022-06-28 ENCOUNTER — Other Ambulatory Visit: Payer: Self-pay | Admitting: Radiology

## 2022-06-28 DIAGNOSIS — Z3041 Encounter for surveillance of contraceptive pills: Secondary | ICD-10-CM

## 2022-06-29 NOTE — Telephone Encounter (Signed)
Med refill request:Lo Loestrin Fe Last AEX: 07/30/21/ JC Next AEX: 08/06/22/ JC Last MMG (if hormonal med) N/A  Spoke with patient, AEX scheduled for 08/06/22 at 1530 with Jami. Will need 1 additional refill prior to appt. Advised will forward request to provider, f/u with pharmacy for filling.   Rx pended #28/0RF Refill authorized: Please Advise?

## 2022-07-24 ENCOUNTER — Other Ambulatory Visit: Payer: Self-pay | Admitting: Radiology

## 2022-07-24 DIAGNOSIS — Z3041 Encounter for surveillance of contraceptive pills: Secondary | ICD-10-CM

## 2022-07-26 NOTE — Telephone Encounter (Signed)
Last AEX 07/30/2021--scheduled for 08/06/2022.

## 2022-08-06 ENCOUNTER — Ambulatory Visit (INDEPENDENT_AMBULATORY_CARE_PROVIDER_SITE_OTHER): Payer: 59 | Admitting: Radiology

## 2022-08-06 ENCOUNTER — Encounter: Payer: Self-pay | Admitting: Radiology

## 2022-08-06 VITALS — BP 110/72 | Ht 65.0 in | Wt 137.0 lb

## 2022-08-06 DIAGNOSIS — Z3041 Encounter for surveillance of contraceptive pills: Secondary | ICD-10-CM

## 2022-08-06 DIAGNOSIS — Z01419 Encounter for gynecological examination (general) (routine) without abnormal findings: Secondary | ICD-10-CM

## 2022-08-06 MED ORDER — LO LOESTRIN FE 1 MG-10 MCG / 10 MCG PO TABS: 1.0000 | ORAL_TABLET | Freq: Every day | ORAL | 4 refills | Status: DC

## 2022-08-06 NOTE — Progress Notes (Signed)
   Nancy Blevins 1990/06/28 [id number]   History:  32 y.o. G0 presents for annual exam. No  gyn concerns, happy on OCPs, needs a refill.  Gynecologic History No LMP recorded. (Menstrual status: Oral contraceptives).   Contraception/Family planning: OCP (estrogen/progesterone) Sexually active: yes Last Pap: 2023. Results were: normal   Obstetric History OB History  Gravida Para Term Preterm AB Living  0 0 0 0 0 0  SAB IAB Ectopic Multiple Live Births  0 0 0 0 0     The following portions of the patient's history were reviewed and updated as appropriate: allergies, current medications, past family history, past medical history, past social history, past surgical history, and problem list.  Review of Systems Pertinent items noted in HPI and remainder of comprehensive ROS otherwise negative.   Past medical history, past surgical history, family history and social history were all reviewed and documented in the EPIC chart.   Exam:  Vitals:   08/06/22 1525  BP: 110/72  Weight: 137 lb (62.1 kg)  Height: 5' 5"$  (1.651 m)   Body mass index is 22.8 kg/m.  General appearance:  Normal Thyroid:  Symmetrical, normal in size, without palpable masses or nodularity. Respiratory  Auscultation:  Clear without wheezing or rhonchi Cardiovascular  Auscultation:  Regular rate, without rubs, murmurs or gallops  Edema/varicosities:  Not grossly evident Abdominal  Soft,nontender, without masses, guarding or rebound.  Liver/spleen:  No organomegaly noted  Hernia:  None appreciated  Skin  Inspection:  Grossly normal Breasts: Examined lying and sitting.   Right: Without masses, retractions, nipple discharge or axillary adenopathy.   Left: Without masses, retractions, nipple discharge or axillary adenopathy. Genitourinary   Inguinal/mons:  Normal without inguinal adenopathy  External genitalia:  Normal appearing vulva with no masses, tenderness, or lesions  BUS/Urethra/Skene's glands:   Normal without masses or exudate  Vagina:  Normal appearing with normal color and discharge, no lesions  Cervix:  Normal appearing without discharge or lesions  Uterus:  Normal in size, shape and contour.  Mobile, nontender  Adnexa/parametria:     Rt: Normal in size, without masses or tenderness.   Lt: Normal in size, without masses or tenderness.  Anus and perineum: Normal   Patient informed chaperone available to be present for breast and pelvic exam. Patient has requested no chaperone to be present. Patient has been advised what will be completed during breast and pelvic exam.   Assessment/Plan:   1. Well woman exam with routine gynecological exam Pap due 2026  2. Oral contraceptive pill surveillance  - Norethindrone-Ethinyl Estradiol-Fe Biphas (LO LOESTRIN FE) 1 MG-10 MCG / 10 MCG tablet; Take 1 tablet by mouth daily.  Dispense: 84 tablet; Refill: 4    Discussed SBE, colonoscopy and DEXA screening as directed/appropriate. Recommend 13mns of exercise weekly, including weight bearing exercise. Encouraged the use of seatbelts and sunscreen. Return in 1 year for annual or as needed.   CRubbie BattiestB WHNP-BC 3:44 PM 08/06/2022

## 2022-09-24 ENCOUNTER — Encounter: Payer: Self-pay | Admitting: Radiology

## 2022-09-24 ENCOUNTER — Ambulatory Visit: Payer: 59 | Admitting: Radiology

## 2022-09-24 VITALS — BP 102/70 | Temp 99.1°F

## 2022-09-24 DIAGNOSIS — N644 Mastodynia: Secondary | ICD-10-CM

## 2022-09-24 NOTE — Progress Notes (Signed)
   Nancy Blevins [birth year] [id number]   History:  32 y.o. G0 presents with complaints of breast pain bilaterally/soreness x 4 weeks.Denies any masses, no caffeine intake. No injury to the chest. Taking OCPs daily. Family hx of breast CA.  Gynecologic History No LMP recorded. (Menstrual status: Oral contraceptives).   Contraception/Family planning: OCP (estrogen/progesterone)   Obstetric History OB History  Gravida Para Term Preterm AB Living  0 0 0 0 0 0  SAB IAB Ectopic Multiple Live Births  0 0 0 0 0     The following portions of the patient's history were reviewed and updated as appropriate: allergies, current medications, past family history, past medical history, past social history, past surgical history, and problem list.  Review of Systems Pertinent items noted in HPI and remainder of comprehensive ROS otherwise negative.   Past medical history, past surgical history, family history and social history were all reviewed and documented in the EPIC chart.   Exam:  Vitals:   09/24/22 0946  BP: 102/70  Temp: 99.1 F (37.3 C)  TempSrc: Oral   There is no height or weight on file to calculate BMI.  General appearance:  Normal Thyroid:  Symmetrical, normal in size, without palpable masses or nodularity. Respiratory  Auscultation:  Clear without wheezing or rhonchi Cardiovascular  Auscultation:  Regular rate, without rubs, murmurs or gallops  Edema/varicosities:  Not grossly evident Breasts: breasts appear normal, no suspicious masses, no skin or nipple changes or axillary nodes.   Patient informed chaperone available to be present for breast exam. Patient has requested no chaperone to be present.   Assessment/Plan:   1. Mastalgia in female Vitamin E 400iu BID x 14 days If pain persists will let me know   Arlie Solomons B WHNP-BC 10:03 AM 09/24/2022

## 2022-10-06 ENCOUNTER — Encounter: Payer: Self-pay | Admitting: Radiology

## 2022-10-06 ENCOUNTER — Telehealth: Payer: Self-pay | Admitting: *Deleted

## 2022-10-06 ENCOUNTER — Ambulatory Visit: Payer: 59 | Admitting: Radiology

## 2022-10-06 VITALS — BP 102/68 | Temp 98.3°F

## 2022-10-06 DIAGNOSIS — R102 Pelvic and perineal pain: Secondary | ICD-10-CM | POA: Diagnosis not present

## 2022-10-06 DIAGNOSIS — N644 Mastodynia: Secondary | ICD-10-CM

## 2022-10-06 DIAGNOSIS — N898 Other specified noninflammatory disorders of vagina: Secondary | ICD-10-CM | POA: Diagnosis not present

## 2022-10-06 DIAGNOSIS — N72 Inflammatory disease of cervix uteri: Secondary | ICD-10-CM

## 2022-10-06 LAB — URINALYSIS, COMPLETE W/RFL CULTURE
Bilirubin Urine: NEGATIVE
Hgb urine dipstick: NEGATIVE
Hyaline Cast: NONE SEEN /LPF
Ketones, ur: NEGATIVE
Nitrites, Initial: NEGATIVE
Specific Gravity, Urine: 1.023 (ref 1.001–1.035)

## 2022-10-06 LAB — WET PREP FOR TRICH, YEAST, CLUE

## 2022-10-06 LAB — PREGNANCY, URINE: Preg Test, Ur: NEGATIVE

## 2022-10-06 MED ORDER — DOXYCYCLINE MONOHYDRATE 100 MG PO CAPS: 100.0000 mg | ORAL_CAPSULE | Freq: Two times a day (BID) | ORAL | 0 refills | Status: DC

## 2022-10-06 NOTE — Telephone Encounter (Signed)
-----   Message from Tanda Rockers, NP sent at 10/06/2022  2:51 PM EDT ----- Regarding: mammogram Please schedule bilateral diagnostic mammogram for persistent bilateral mastalgia.

## 2022-10-06 NOTE — Progress Notes (Signed)
      Subjective: Nancy Blevins is a 32 y.o. female who complains of period like cramping x 12 days. Increased vaginal discharge, no new partners. No urinary symptoms or changes in bowel habits. No missed pills or BTB.  Continue to have bilateral breast pain despite trying vit e 400iu BID   Review of Systems  All other systems reviewed and are negative.   Past Medical History:  Diagnosis Date   Anxiety    Asthma       Objective:  Today's Vitals   10/06/22 1433  BP: 102/68  Temp: 98.3 F (36.8 C)  TempSrc: Oral   There is no height or weight on file to calculate BMI.   -General: no acute distress -Vulva: without lesions or discharge -Vagina: thick white discharge present, wet prep obtained -Cervix: + erythema of cervix with mucopurulent discharge no CMT -Perineum: no lesions -Uterus: Mobile, non tender -Adnexa: no masses or tenderness  Breasts: breasts appear normal, no suspicious masses, no skin or nipple changes or axillary nodes, + tenderness bilaterally.   Microscopic wet-mount exam shows excessive bacteria.   Raynelle Fanning, CMA present for exam  Assessment:/Plan:   1. Pelvic pain - Pregnancy, urine - Urinalysis,Complete w/RFL Culture  2. Vaginal discharge - WET PREP FOR TRICH, YEAST, CLUE  3. Cervicitis - doxycycline (MONODOX) 100 MG capsule; Take 1 capsule (100 mg total) by mouth 2 (two) times daily.  Dispense: 14 capsule; Refill: 0  4. Mastalgia in female Diagnostic mammogram ordered    Avoid intercourse until symptoms are resolved. Safe sex encouraged. Avoid the use of soaps or perfumed products in the peri area. Avoid tub baths and sitting in sweaty or wet clothing for prolonged periods of time.

## 2022-10-07 LAB — URINALYSIS, COMPLETE W/RFL CULTURE: pH: 5.5 (ref 5.0–8.0)

## 2022-10-07 NOTE — Telephone Encounter (Signed)
Call placed to [facility], spoke with Sue Lush.   Patient scheduled for Bilateral DX MMG and L/R [place] if needed on 11/18/22 at 1040.   Spoke with patient, advised of appt as seen above. Patient verbalizes understanding and is agreeable.   Routing to provider for final review. Patient is agreeable to disposition. Will close encounter.

## 2022-10-08 LAB — URINE CULTURE
MICRO NUMBER:: 14867801
Result:: NO GROWTH
SPECIMEN QUALITY:: ADEQUATE

## 2022-10-08 LAB — URINALYSIS, COMPLETE W/RFL CULTURE
Glucose, UA: NEGATIVE
Protein, ur: NEGATIVE

## 2022-10-08 LAB — CULTURE INDICATED

## 2022-11-18 ENCOUNTER — Ambulatory Visit: Payer: Self-pay

## 2022-11-18 ENCOUNTER — Ambulatory Visit
Admission: RE | Admit: 2022-11-18 | Discharge: 2022-11-18 | Disposition: A | Discharge status: Home / Self Care | Payer: 59 | Source: Ambulatory Visit | Attending: Radiology | Admitting: Radiology

## 2022-11-18 ENCOUNTER — Ambulatory Visit: Admission: RE | Admit: 2022-11-18 | Payer: Self-pay | Source: Ambulatory Visit

## 2022-11-18 DIAGNOSIS — N644 Mastodynia: Secondary | ICD-10-CM

## 2022-11-30 ENCOUNTER — Encounter: Payer: Self-pay | Admitting: Radiology

## 2022-11-30 ENCOUNTER — Ambulatory Visit: Payer: 59 | Admitting: Radiology

## 2022-11-30 VITALS — BP 98/62

## 2022-11-30 DIAGNOSIS — R102 Pelvic and perineal pain: Secondary | ICD-10-CM | POA: Diagnosis not present

## 2022-11-30 NOTE — Progress Notes (Signed)
      Subjective: Nancy Blevins is a 32 y.o. female who complains of period like cramping x 2 months. Felt better after taking doxycycline, symptoms then returned, worse over the past 2 weeks. Has appt with GI next month.   Review of Systems  All other systems reviewed and are negative.   Past Medical History:  Diagnosis Date   Anxiety    Asthma       Objective:  Today's Vitals   11/30/22 1140  BP: 98/62   There is no height or weight on file to calculate BMI.   -General: no acute distress -Vulva: without lesions or discharge -Vagina: no discharge or lesions -Cervix: normal without lesions or discharge -Perineum: no lesions -Uterus: Mobile, non tender -Adnexa: no masses or tenderness    Raynelle Fanning, CMA present for exam  Assessment:/Plan:   1. Pelvic pain - US Transvaginal Non-OB; Future  Keep GI appt  Arlie Solomons [facility] 11/30/22 1149

## 2022-12-28 NOTE — Telephone Encounter (Signed)
Bladder u/s would be done with PCP or urology, not gyn.

## 2023-01-06 ENCOUNTER — Ambulatory Visit (INDEPENDENT_AMBULATORY_CARE_PROVIDER_SITE_OTHER): Payer: 59

## 2023-01-06 ENCOUNTER — Ambulatory Visit: Payer: 59 | Admitting: Radiology

## 2023-01-06 VITALS — BP 110/64

## 2023-01-06 DIAGNOSIS — R102 Pelvic and perineal pain: Secondary | ICD-10-CM

## 2023-01-06 DIAGNOSIS — Q513 Bicornate uterus: Secondary | ICD-10-CM | POA: Diagnosis not present

## 2023-01-06 NOTE — Progress Notes (Signed)
      Subjective: Nancy Blevins is a 32 y.o. female here for ultrasound. She complained of period like cramping x 2 months. Felt better after taking doxycycline. GI has her on an elimination diet and symptoms have improved however does still have cramping on her 3rd week of OCPs.   Review of Systems  All other systems reviewed and are negative.   Past Medical History:  Diagnosis Date   Anxiety    Asthma       Objective:  Today's Vitals   01/06/23 1216  BP: 110/64   There is no height or weight on file to calculate BMI.   Narrative & Impression Indication: pelvic pain   Anteverted bicornuate uterus  Endometrial thickness in both horns 2.22mm   No masses or thickening seen   Both ovaries mobile, normal size with normal follicle pattern and normal perfusion   No adnexal masses or free fluid   Impression: bicornuate uterus with thin endometrial stripe         Exam Ended: 01/06/23 12:22 Last Resulted: 01/06/23 12:36      Assessment:/Plan:  1. Bicornuate uterus Incidental finding. Discussed with patient  2. Pelvic pain Continue to monitor and follow GI care plan Follow up as needed Continue LoLoestrin as prescribed    Arlie Solomons [facility] 11/30/22 1149

## 2023-09-01 ENCOUNTER — Encounter: Payer: Self-pay | Admitting: Radiology

## 2023-09-01 ENCOUNTER — Ambulatory Visit (INDEPENDENT_AMBULATORY_CARE_PROVIDER_SITE_OTHER): Payer: 59 | Admitting: Radiology

## 2023-09-01 VITALS — BP 98/66 | HR 90 | Ht 65.0 in | Wt 128.8 lb

## 2023-09-01 DIAGNOSIS — Z3041 Encounter for surveillance of contraceptive pills: Secondary | ICD-10-CM | POA: Diagnosis not present

## 2023-09-01 DIAGNOSIS — Z01419 Encounter for gynecological examination (general) (routine) without abnormal findings: Secondary | ICD-10-CM | POA: Diagnosis not present

## 2023-09-01 DIAGNOSIS — N898 Other specified noninflammatory disorders of vagina: Secondary | ICD-10-CM | POA: Diagnosis not present

## 2023-09-01 DIAGNOSIS — Z1331 Encounter for screening for depression: Secondary | ICD-10-CM

## 2023-09-01 LAB — WET PREP FOR TRICH, YEAST, CLUE

## 2023-09-01 MED ORDER — LO LOESTRIN FE 1 MG-10 MCG / 10 MCG PO TABS
1.0000 | ORAL_TABLET | Freq: Every day | ORAL | 4 refills | Status: AC
Start: 2023-09-01 — End: ?

## 2023-09-01 NOTE — Progress Notes (Signed)
 Nancy Blevins 04/26/1991 [id number]   History:  33 y.o. G0 presents for annual exam. C/o continued breast tenderness, normal mammogram, vit E no help. Also reports thick white vaginal discharge. Happy with current OCPs.  Gynecologic History No LMP recorded. (Menstrual status: Oral contraceptives).    Contraception/Family planning: OCP (estrogen/progesterone) Sexually active: yes Last Pap: 2023. Results were: normal   Obstetric History OB History  Gravida Para Term Preterm AB Living  0 0 0 0 0 0  SAB IAB Ectopic Multiple Live Births  0 0 0 0 0       09/01/2023   10:41 AM  Depression screen PHQ 2/9  Decreased Interest 0  Down, Depressed, Hopeless 0  PHQ - 2 Score 0     The following portions of the patient's history were reviewed and updated as appropriate: allergies, current medications, past family history, past medical history, past social history, past surgical history, and problem list.  Review of Systems  All other systems reviewed and are negative.   Past medical history, past surgical history, family history and social history were all reviewed and documented in the EPIC chart.  Exam:  Vitals:   09/01/23 1040  BP: 98/66  Pulse: 90  SpO2: 90%  Weight: 128 lb 12.8 oz (58.4 kg)  Height: 5\' 5"  (1.651 m)   Body mass index is 21.43 kg/m.  Physical Exam Vitals and nursing note reviewed. Exam conducted with a chaperone present.  Constitutional:      Appearance: Normal appearance. She is normal weight.  HENT:     Head: Normocephalic and atraumatic.  Neck:     Thyroid: No thyroid mass, thyromegaly or thyroid tenderness.  Cardiovascular:     Rate and Rhythm: Regular rhythm.     Heart sounds: Normal heart sounds.  Pulmonary:     Effort: Pulmonary effort is normal.     Breath sounds: Normal breath sounds.  Chest:  Breasts:    Breasts are symmetrical.     Right: Normal. No inverted nipple, mass, nipple discharge, skin change or tenderness.     Left:  Normal. No inverted nipple, mass, nipple discharge, skin change or tenderness.  Abdominal:     General: Abdomen is flat. Bowel sounds are normal.     Palpations: Abdomen is soft.  Genitourinary:    General: Normal vulva.     Vagina: Normal. No vaginal discharge, bleeding or lesions.     Cervix: Normal. No discharge or lesion.     Uterus: Normal. Not enlarged and not tender.      Adnexa: Right adnexa normal and left adnexa normal.       Right: No mass, tenderness or fullness.         Left: No mass, tenderness or fullness.    Lymphadenopathy:     Upper Body:     Right upper body: No axillary adenopathy.     Left upper body: No axillary adenopathy.  Skin:    General: Skin is warm and dry.  Neurological:     Mental Status: She is alert and oriented to person, place, and time.  Psychiatric:        Mood and Affect: Mood normal.        Thought Content: Thought content normal.        Judgment: Judgment normal.      Raynelle Fanning, CMA present for exam  Assessment/Plan:   1. Well woman exam with routine gynecological exam (Primary) Pap 2026  2. Oral contraceptive pill surveillance If  breast pain continues may switch to Nexstellis - Norethindrone-Ethinyl Estradiol-Fe Biphas (LO LOESTRIN FE) 1 MG-10 MCG / 10 MCG tablet; Take 1 tablet by mouth daily.  Dispense: 84 tablet; Refill: 4  3. Vaginal discharge - WET PREP FOR TRICH, YEAST, CLUE; negative    Delwin Raczkowski B WHNP-BC 10:56 AM 09/01/2023

## 2023-11-14 NOTE — Telephone Encounter (Signed)
 Per review of AEX 09/01/23, if breast pain continues may switch to Nexstellis.   Please advise on breast imaging.

## 2023-11-15 ENCOUNTER — Other Ambulatory Visit: Payer: Self-pay | Admitting: Radiology

## 2023-11-15 DIAGNOSIS — N644 Mastodynia: Secondary | ICD-10-CM

## 2023-12-01 ENCOUNTER — Other Ambulatory Visit: Payer: Self-pay | Admitting: Radiology

## 2023-12-01 ENCOUNTER — Encounter: Payer: Self-pay | Admitting: Radiology

## 2023-12-01 DIAGNOSIS — N644 Mastodynia: Secondary | ICD-10-CM

## 2023-12-02 ENCOUNTER — Other Ambulatory Visit: Payer: Self-pay | Admitting: Radiology

## 2023-12-02 DIAGNOSIS — N644 Mastodynia: Secondary | ICD-10-CM

## 2024-01-10 ENCOUNTER — Ambulatory Visit
Admission: RE | Admit: 2024-01-10 | Discharge: 2024-01-10 | Disposition: A | Discharge status: Home / Self Care | Source: Ambulatory Visit | Attending: Radiology | Admitting: Radiology

## 2024-01-10 DIAGNOSIS — N644 Mastodynia: Secondary | ICD-10-CM
